# Patient Record
Sex: Male | Born: 1940 | Race: White | Hispanic: No | Marital: Married | Smoking: Former smoker
Health system: Southern US, Community
[De-identification: ages and names within clinical notes are randomized; demographics above are authoritative.]

## PROBLEM LIST (undated history)

## (undated) DIAGNOSIS — K219 Gastro-esophageal reflux disease without esophagitis: Secondary | ICD-10-CM

## (undated) DIAGNOSIS — N209 Urinary calculus, unspecified: Secondary | ICD-10-CM

## (undated) HISTORY — PX: EYE SURGERY: SHX253

---

## 2019-10-07 HISTORY — PX: CATARACT EXTRACTION: SUR2

## 2019-11-07 DIAGNOSIS — R17 Unspecified jaundice: Secondary | ICD-10-CM

## 2019-11-07 HISTORY — DX: Unspecified jaundice: R17

## 2019-11-29 ENCOUNTER — Inpatient Hospital Stay (HOSPITAL_COMMUNITY)
Admission: EM | Admit: 2019-11-29 | Discharge: 2019-12-03 | DRG: 435 | Disposition: A | Discharge status: Home / Self Care | Payer: Medicare Other | Source: Ambulatory Visit | Attending: Internal Medicine | Admitting: Internal Medicine

## 2019-11-29 ENCOUNTER — Ambulatory Visit: Payer: Medicare Other | Admitting: General Surgery

## 2019-11-29 ENCOUNTER — Encounter (HOSPITAL_COMMUNITY): Payer: Self-pay | Admitting: Emergency Medicine

## 2019-11-29 ENCOUNTER — Encounter (INDEPENDENT_AMBULATORY_CARE_PROVIDER_SITE_OTHER): Payer: Self-pay

## 2019-11-29 ENCOUNTER — Emergency Department (HOSPITAL_COMMUNITY): Payer: Medicare Other

## 2019-11-29 ENCOUNTER — Other Ambulatory Visit: Payer: Self-pay

## 2019-11-29 DIAGNOSIS — C801 Malignant (primary) neoplasm, unspecified: Secondary | ICD-10-CM | POA: Diagnosis not present

## 2019-11-29 DIAGNOSIS — N401 Enlarged prostate with lower urinary tract symptoms: Secondary | ICD-10-CM | POA: Diagnosis present

## 2019-11-29 DIAGNOSIS — R822 Biliuria: Secondary | ICD-10-CM | POA: Diagnosis present

## 2019-11-29 DIAGNOSIS — Z7982 Long term (current) use of aspirin: Secondary | ICD-10-CM | POA: Diagnosis not present

## 2019-11-29 DIAGNOSIS — Z20822 Contact with and (suspected) exposure to covid-19: Secondary | ICD-10-CM | POA: Diagnosis present

## 2019-11-29 DIAGNOSIS — Z87891 Personal history of nicotine dependence: Secondary | ICD-10-CM

## 2019-11-29 DIAGNOSIS — D689 Coagulation defect, unspecified: Secondary | ICD-10-CM | POA: Diagnosis present

## 2019-11-29 DIAGNOSIS — Z79899 Other long term (current) drug therapy: Secondary | ICD-10-CM

## 2019-11-29 DIAGNOSIS — E872 Acidosis: Secondary | ICD-10-CM | POA: Diagnosis not present

## 2019-11-29 DIAGNOSIS — K8689 Other specified diseases of pancreas: Secondary | ICD-10-CM | POA: Diagnosis not present

## 2019-11-29 DIAGNOSIS — D649 Anemia, unspecified: Secondary | ICD-10-CM | POA: Diagnosis present

## 2019-11-29 DIAGNOSIS — R1310 Dysphagia, unspecified: Secondary | ICD-10-CM | POA: Diagnosis present

## 2019-11-29 DIAGNOSIS — C250 Malignant neoplasm of head of pancreas: Secondary | ICD-10-CM | POA: Diagnosis present

## 2019-11-29 DIAGNOSIS — D630 Anemia in neoplastic disease: Secondary | ICD-10-CM | POA: Diagnosis present

## 2019-11-29 DIAGNOSIS — L299 Pruritus, unspecified: Secondary | ICD-10-CM | POA: Diagnosis not present

## 2019-11-29 DIAGNOSIS — K903 Pancreatic steatorrhea: Secondary | ICD-10-CM | POA: Diagnosis present

## 2019-11-29 DIAGNOSIS — K219 Gastro-esophageal reflux disease without esophagitis: Secondary | ICD-10-CM | POA: Diagnosis present

## 2019-11-29 DIAGNOSIS — M7989 Other specified soft tissue disorders: Secondary | ICD-10-CM | POA: Diagnosis present

## 2019-11-29 DIAGNOSIS — R634 Abnormal weight loss: Secondary | ICD-10-CM | POA: Diagnosis present

## 2019-11-29 DIAGNOSIS — R823 Hemoglobinuria: Secondary | ICD-10-CM | POA: Diagnosis present

## 2019-11-29 DIAGNOSIS — Z9889 Other specified postprocedural states: Secondary | ICD-10-CM | POA: Diagnosis not present

## 2019-11-29 DIAGNOSIS — R609 Edema, unspecified: Secondary | ICD-10-CM | POA: Diagnosis not present

## 2019-11-29 DIAGNOSIS — E785 Hyperlipidemia, unspecified: Secondary | ICD-10-CM | POA: Diagnosis present

## 2019-11-29 DIAGNOSIS — K831 Obstruction of bile duct: Secondary | ICD-10-CM | POA: Diagnosis present

## 2019-11-29 DIAGNOSIS — Z6825 Body mass index (BMI) 25.0-25.9, adult: Secondary | ICD-10-CM | POA: Diagnosis not present

## 2019-11-29 DIAGNOSIS — R17 Unspecified jaundice: Secondary | ICD-10-CM | POA: Diagnosis present

## 2019-11-29 HISTORY — DX: Gastro-esophageal reflux disease without esophagitis: K21.9

## 2019-11-29 HISTORY — DX: Urinary calculus, unspecified: N20.9

## 2019-11-29 LAB — CBC
HCT: 35 % — ABNORMAL LOW (ref 39.0–52.0)
Hemoglobin: 11.5 g/dL — ABNORMAL LOW (ref 13.0–17.0)
MCH: 30.3 pg (ref 26.0–34.0)
MCHC: 32.9 g/dL (ref 30.0–36.0)
MCV: 92.3 fL (ref 80.0–100.0)
Platelets: 320 10*3/uL (ref 150–400)
RBC: 3.79 MIL/uL — ABNORMAL LOW (ref 4.22–5.81)
RDW: 24 % — ABNORMAL HIGH (ref 11.5–15.5)
WBC: 8.5 10*3/uL (ref 4.0–10.5)
nRBC: 0 % (ref 0.0–0.2)

## 2019-11-29 LAB — URINALYSIS, ROUTINE W REFLEX MICROSCOPIC
Bacteria, UA: NONE SEEN
Glucose, UA: NEGATIVE mg/dL
Ketones, ur: NEGATIVE mg/dL
Leukocytes,Ua: NEGATIVE
Nitrite: NEGATIVE
Protein, ur: NEGATIVE mg/dL
Specific Gravity, Urine: >1.046 — ABNORMAL HIGH (ref 1.005–1.030)
pH: 5 (ref 5.0–8.0)

## 2019-11-29 LAB — COMPREHENSIVE METABOLIC PANEL
ALT: 425 U/L — ABNORMAL HIGH (ref 0–44)
AST: 758 U/L — ABNORMAL HIGH (ref 15–41)
Albumin: 2.5 g/dL — ABNORMAL LOW (ref 3.5–5.0)
Alkaline Phosphatase: 658 U/L — ABNORMAL HIGH (ref 38–126)
Anion gap: 10 (ref 5–15)
BUN: 28 mg/dL — ABNORMAL HIGH (ref 8–23)
CO2: 23 mmol/L (ref 22–32)
Calcium: 9.6 mg/dL (ref 8.9–10.3)
Chloride: 100 mmol/L (ref 98–111)
Creatinine, Ser: 0.86 mg/dL (ref 0.61–1.24)
GFR calc Af Amer: >60 mL/min (ref >60)
GFR calc non Af Amer: >60 mL/min (ref >60)
Glucose, Bld: 107 mg/dL — ABNORMAL HIGH (ref 70–99)
Potassium: 4.1 mmol/L (ref 3.5–5.1)
Sodium: 133 mmol/L — ABNORMAL LOW (ref 135–145)
Total Bilirubin: 19.7 mg/dL (ref 0.3–1.2)
Total Protein: 6.4 g/dL — ABNORMAL LOW (ref 6.5–8.1)

## 2019-11-29 LAB — PROTIME-INR
INR: 3.6 — ABNORMAL HIGH (ref 0.8–1.2)
Prothrombin Time: 36.2 seconds — ABNORMAL HIGH (ref 11.4–15.2)

## 2019-11-29 LAB — MAGNESIUM: Magnesium: 1.9 mg/dL (ref 1.7–2.4)

## 2019-11-29 LAB — PHOSPHORUS: Phosphorus: 3.1 mg/dL (ref 2.5–4.6)

## 2019-11-29 LAB — LIPASE, BLOOD: Lipase: 40 U/L (ref 11–51)

## 2019-11-29 MED ORDER — VITAMIN K1 10 MG/ML IJ SOLN
INTRAMUSCULAR | Status: AC
  Filled 2019-11-29: qty 1

## 2019-11-29 MED ORDER — SODIUM CHLORIDE 0.9 % IV SOLN: INTRAVENOUS | Status: DC

## 2019-11-29 MED ORDER — DIPHENHYDRAMINE HCL 25 MG PO CAPS
25.0000 mg | ORAL_CAPSULE | Freq: Four times a day (QID) | ORAL | Status: DC | PRN
  Administered 2019-11-29: 25 mg via ORAL
  Filled 2019-11-29: qty 1

## 2019-11-29 MED ORDER — VITAMIN K1 10 MG/ML IJ SOLN
5.0000 mg | Freq: Once | INTRAVENOUS | Status: AC
  Administered 2019-11-29: 20:00:00 5 mg via INTRAVENOUS
  Filled 2019-11-29: qty 0.5

## 2019-11-29 MED ORDER — IOHEXOL 300 MG/ML  SOLN
100.0000 mL | Freq: Once | INTRAMUSCULAR | Status: AC | PRN
  Administered 2019-11-29: 100 mL via INTRAVENOUS

## 2019-11-29 MED ORDER — SENNOSIDES-DOCUSATE SODIUM 8.6-50 MG PO TABS
1.0000 | ORAL_TABLET | Freq: Two times a day (BID) | ORAL | Status: DC
  Administered 2019-11-29 – 2019-12-03 (×5): 1 via ORAL
  Filled 2019-11-29 (×6): qty 1

## 2019-11-29 MED ORDER — OXYCODONE HCL 5 MG PO TABS
5.0000 mg | ORAL_TABLET | Freq: Four times a day (QID) | ORAL | Status: DC | PRN
  Administered 2019-12-01: 06:00:00 5 mg via ORAL
  Filled 2019-11-29 (×2): qty 1

## 2019-11-29 MED ORDER — SODIUM CHLORIDE 0.9% FLUSH
3.0000 mL | Freq: Once | INTRAVENOUS | Status: AC
  Administered 2019-11-29: 3 mL via INTRAVENOUS

## 2019-11-29 NOTE — H&P (Signed)
History and Physical    Jose Hale [id number] DOB: Oct 31, 1940 DOA: 11/29/2019  PCP: Quentin Cornwall, MD   Patient coming from: Home.  I have personally briefly reviewed patient's old medical records in [place]  Chief Complaint: Jaundice.  HPI: Jose Hale is a 79 y.o. male with medical history significant of GERD, urolithiasis who is coming to the emergency department referred by Dr. Arnoldo Morale after he presented there with 3-4 weeks of jaundice associated with abdominal distention, abdominal pain, light-colored stools, decreased appetite, nonintentional weight loss of about 10 pounds, fatigue,  pruritus and dark-colored urine.  He mentions that since mid January, after his cataract surgery, he felt like his digestion was not normal.  He denies diarrhea, but state he has felt constipated at times.  No melena or hematochezia.  He denies dysuria, frequency or hematuria.  About 2 weeks later, he was told that he looked jaundiced and he subsequently made an appointment with his doctor.  They had lab work, ultrasound all his RUQ done and told him that he needed to be seen by surgery and gastroenterology.  He went to see Dr. Arnoldo Morale today, who immediately referred him to the emergency department for further evaluation.  He denies fever, chills, sore throat, rhinorrhea, wheezing or hemoptysis.  No chest pain, dyspnea, palpitations, PND, orthopnea or lower extremity edema.  No polyuria, polydipsia, polyphagia or blurred vision.  ED Course: Initial vital signs temperature 98.6 F, pulse 70, respirations 16, blood pressure 131/83 mmHg and O2 sat 99% on room air.  Dr. Ashok Cordia discussed the case with gastroenterology, will evaluate the patient in the morning.  Urinalysis was amber in color, specific gravity was more than 1.046 with moderate hemoglobinuria and bilirubinuria.  White count was 8.5, hemoglobin 11.5 g/dL and platelets 320.  PT 36.2 and INR 3.6.  CMP shows sodium 133 mmol/L.  Calcium is  9.6, but corrected to albumin is 10.8 mg/dL.  All other electrolytes are within normal range glucose 107, BUN 28 and creatinine 0.86 mg/dL.  Total protein 6.4, albumin 2.5 g/dL.  AST 758, ALT 425 alkaline phosphatase 658 units/L.  Total bilirubin is 19.7 mg/dL.  Lipase was normal.  Review of Systems: As per HPI otherwise 10 point review of systems negative.   Past Medical History:  Diagnosis Date  . GERD (gastroesophageal reflux disease)   . Jaundice 11/2019  . Urolithiasis     Past Surgical History:  Procedure Laterality Date  . APPENDECTOMY  1981  . CATARACT EXTRACTION Left 10/2019     reports that he has quit smoking. He has never used smokeless tobacco. He reports current alcohol use of about 5.0 standard drinks of alcohol per week. He reports that he does not use drugs.  Not on File  Family History  Problem Relation Age of Onset  . Gallbladder disease Mother   . Heart disease Father   . Benign prostatic hyperplasia Father   . Gallbladder disease Sister   . GER disease Sister    Prior to Admission medications   Medication Sig Start Date End Date Taking? Authorizing Provider  aspirin 81 MG chewable tablet Chew by mouth.    [provider]  atorvastatin (LIPITOR) 40 MG tablet Take 40 mg by mouth daily. 10/31/19   [provider]  dutasteride (AVODART) 0.5 MG capsule Take 0.5 mg by mouth daily. 09/27/19   [provider]  LOTEMAX SM 0.38 % GEL  10/22/19   [provider]  moxifloxacin (VIGAMOX) 0.5 % ophthalmic solution  10/22/19   [provider]  MURO 128 2 % ophthalmic solution  10/13/19   [provider]  pantoprazole (PROTONIX) 40 MG tablet Take 40 mg by mouth daily. 07/20/19   [provider]  PROLENSA 0.07 % SOLN  08/26/19   [provider]  tamsulosin (FLOMAX) 0.4 MG CAPS capsule Take 0.4 mg by mouth daily. 08/17/19   [provider]  TRICOR 145 MG tablet Take 145 mg by mouth daily. 11/15/19    [provider]  ZYLET 0.5-0.3 % SUSP  09/14/19   [provider]    Physical Exam: Vitals:   11/29/19 2000 11/29/19 2030 11/29/19 2100 11/29/19 2137  BP: 106/70 118/64 119/68 (!) 145/72  Pulse: 70 64 69 80  Resp: 12 18 (!) 22   Temp:    98.4 F (36.9 C)  TempSrc:    Oral  SpO2: 100% 98% 98% 100%  Weight:    70.6 kg  Height:    5\' 6"  (1.676 m)    Constitutional: Looks chronically ill, but in NAD, calm, comfortable Eyes: PERRL, lids and conjunctivae normal.  Positive scleral icterus. ENMT: Mucous membranes are moist. Posterior pharynx clear of any exudate or lesions. Neck: normal, supple, no masses, no thyromegaly Respiratory: clear to auscultation bilaterally, no wheezing, no crackles. Normal respiratory effort. No accessory muscle use.  Cardiovascular: Regular rate and rhythm, no murmurs / rubs / gallops. No extremity edema. 2+ pedal pulses. No carotid bruits.  Abdomen: Distended.  BS positive.  Soft, positive epigastric tenderness, no guarding or rebound.  Positive hepatosplenomegaly. Musculoskeletal: no clubbing / cyanosis. Good ROM, no contractures. Normal muscle tone.  Skin: Jaundice with some erythematosus lesions on abdomen. Neurologic: CN 2-12 grossly intact. Sensation intact, DTR normal. Strength 5/5 in all 4.  Psychiatric: Normal judgment and insight. Alert and oriented x 3. Normal mood.   Labs on Admission: I have personally reviewed following labs and imaging studies  CBC: Recent Labs  Lab 11/29/19 1540  WBC 8.5  HGB 11.5*  HCT 35.0*  MCV 92.3  PLT 99991111   Basic Metabolic Panel: Recent Labs  Lab 11/29/19 1540  NA 133*  K 4.1  CL 100  CO2 23  GLUCOSE 107*  BUN 28*  CREATININE 0.86  CALCIUM 9.6  MG 1.9  PHOS 3.1   GFR: Estimated Creatinine Clearance: 64.9 mL/min (by C-G formula based on SCr of 0.86 mg/dL). Liver Function Tests: Recent Labs  Lab 11/29/19 1540  AST 758*  ALT 425*  ALKPHOS 658*  BILITOT 19.7*  PROT 6.4*    ALBUMIN 2.5*   Recent Labs  Lab 11/29/19 1540  LIPASE 40   No results for input(s): AMMONIA in the last 168 hours. Coagulation Profile: Recent Labs  Lab 11/29/19 1540  INR 3.6*   Cardiac Enzymes: No results for input(s): CKTOTAL, CKMB, CKMBINDEX, TROPONINI in the last 168 hours. BNP (last 3 results) No results for input(s): PROBNP in the last 8760 hours. HbA1C: No results for input(s): HGBA1C in the last 72 hours. CBG: No results for input(s): GLUCAP in the last 168 hours. Lipid Profile: No results for input(s): CHOL, HDL, LDLCALC, TRIG, CHOLHDL, LDLDIRECT in the last 72 hours. Thyroid Function Tests: No results for input(s): TSH, T4TOTAL, FREET4, T3FREE, THYROIDAB in the last 72 hours. Anemia Panel: No results for input(s): VITAMINB12, FOLATE, FERRITIN, TIBC, IRON, RETICCTPCT in the last 72 hours. Urine analysis:    Component Value Date/Time   COLORURINE AMBER (A) 11/29/2019 1828   APPEARANCEUR CLEAR 11/29/2019 1828  LABSPEC >1.046 (H) 11/29/2019 1828   PHURINE 5.0 11/29/2019 1828   GLUCOSEU NEGATIVE 11/29/2019 1828   HGBUR MODERATE (A) 11/29/2019 1828   BILIRUBINUR MODERATE (A) 11/29/2019 1828   KETONESUR NEGATIVE 11/29/2019 1828   PROTEINUR NEGATIVE 11/29/2019 1828   NITRITE NEGATIVE 11/29/2019 1828   LEUKOCYTESUR NEGATIVE 11/29/2019 1828    Radiological Exams on Admission: CT Abdomen Pelvis W Contrast  Result Date: 11/29/2019 CLINICAL DATA:  Painless jaundice. EXAM: CT ABDOMEN AND PELVIS WITH CONTRAST TECHNIQUE: Multidetector CT imaging of the abdomen and pelvis was performed using the standard protocol following bolus administration of intravenous contrast. CONTRAST:  115mL OMNIPAQUE IOHEXOL 300 MG/ML  SOLN COMPARISON:  None. FINDINGS: Lower chest: Bilateral pulmonary nodules. An irregular left lower lobe 1.9 cm nodule on 01/04. More posterior left lower lobe 1.6 cm nodule on 18/4. Normal heart size without pericardial or pleural effusion. Hepatobiliary: No  focal liver lesion. Gallbladder distension, without specific evidence of acute cholecystitis. Moderate intrahepatic biliary duct dilatation. The common duct measures 1.7 cm on coronal image 40. Followed to the level of the pancreatic head, where it undergoes an abrupt cutoff. Pancreas: Pancreatic atrophy and upstream duct dilatation. This continues to the level of the pancreatic head/neck junction, which soft tissue fullness measures on the order of 2.7 x 3.0 cm on 27/2. Subtle peripancreatic edema, for which superimposed pancreatitis cannot be excluded. Example thickening of the anterior pararenal fascia on 24/2. Spleen: Too small to characterize splenic lesions are of doubtful clinical significance. There is a hyperenhancing focus about the posterior spleen which may represent a hemangioma at 1.5 cm on 21/2. Adrenals/Urinary Tract: Normal adrenal glands. Punctate lower pole left renal collecting system calculi. Bilateral too small to characterize renal lesions. No hydronephrosis. Right bladder base 1.8 cm stone. Stomach/Bowel: Proximal gastric underdistention. Apparent gastric wall thickening, including on 18/2, is at least partially secondary. Scattered colonic diverticula.  Normal small bowel. Vascular/Lymphatic: Aortic atherosclerosis. Portal vein and splenoportal confluence involvement by tumor including on 28/2. No acute thrombus. 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 Preaortic node is not pathologic by size criteria at 8 mm on 37/2. No pelvic sidewall adenopathy. Reproductive: Normal prostate. Other: Small volume pelvic fluid. Multifocal omental thickening/nodularity, including at 1.9 cm on 43/2. Musculoskeletal: Bilateral hip degenerative changes. IMPRESSION: 1. 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 2. Moderate biliary and gallbladder distension/dilatation.  No specific evidence of acute cholecystitis. 3. Possible peripancreatic edema for which pancreatitis cannot be excluded. 4. Right bladder stone without hydronephrosis. 5. Left nephrolithiasis. 6. Small volume pelvic fluid. Electronically Signed   By: Abigail Miyamoto M.D.   On: 11/29/2019 18:39    EKG: Independently reviewed.  Assessment/Plan Principal Problem:   Hyperbilirubinemia Secondary to pancreatic mass. Admit to MedSurg/inpatient. Consult gastroenterology. Will likely need a CBD stent placement. N.p.o. after midnight. Benadryl 25 mg Q 6 hours as needed for pruritus Oxycodone 5 mg Q 6 hours as needed for pain.  Active Problems:   Pancreatic mass Check CA 19-9 marker. Check CEA. Staging imaging after 24+ hours from CT abdomen/pelvis.    Normocytic anemia Check anemia panel. Monitor H&H.    GERD (gastroesophageal reflux disease) Continue PPI.   DVT prophylaxis: SCDs. Code Status: Full code. Family Communication: Disposition Plan: Admit for GI evaluation, Probable ERCP and mass biopsy. Consults called: Routine gastroenterology consult. Admission status: Inpatient/MedSurg.   Reubin Milan MD [facility]  If 7PM-7AM, please contact night-coverage [email or web address]  11/29/2019, 11:53 PM   This document was prepared using Dragon voice recognition software and may contain some unintended transcription errors.

## 2019-11-29 NOTE — Progress Notes (Signed)
INR and PTT to be checked in am.

## 2019-11-29 NOTE — ED Provider Notes (Signed)
Patient sent by Dr. Arnoldo Morale with painless jaundice.  He apparently had a work-up in [place] and may have a common bile duct stone.  He likely needs admission and abdominal imaging as well as GI consultation.   Jose Essex, MD 11/29/19 [phone]

## 2019-11-29 NOTE — ED Provider Notes (Addendum)
[facility] Provider Note   CSN: [id number] Arrival date & time: 11/29/19  1409     History Chief Complaint  Patient presents with  . Abdominal Pain    Jose Hale is a 79 y.o. male.  He has a history of reflux.  He said he has been having on and off abdominal pain since January associate with sometimes some constipation sometimes diarrhea.  He has had fatigue.  Recently noticed he was turning yellow, dark urine, clay colored stools.  His primary care doctor said he was having liver problems and ordered an ultrasound that possibly showed some stones.  He saw Dr. Arnoldo Morale today general surgery referral who asked the patient come to the emergency department for admission.  No fevers or chills.  He says he drinks probably 3 times a week and no history of injection drug use.  He did have blood transfusions remotely.  Rates the pain is mild currently diffuse  The history is provided by the patient.  Abdominal Pain Pain location:  Generalized Pain quality: aching and bloating   Pain radiates to:  Does not radiate Pain severity:  Mild Onset quality:  Gradual Timing:  Intermittent Progression:  Worsening Chronicity:  New Context: alcohol use   Context: not trauma   Relieved by:  Nothing Worsened by:  Position changes and movement Ineffective treatments:  OTC medications Associated symptoms: constipation, diarrhea, fatigue and nausea   Associated symptoms: no chest pain, no cough, no dysuria, no fever, no hematemesis, no hematochezia, no hematuria, no melena, no shortness of breath, no sore throat and no vomiting        Past Medical History:  Diagnosis Date  . GERD (gastroesophageal reflux disease)   . Jaundice 11/2019    There are no problems to display for this patient.   Past Surgical History:  Procedure Laterality Date  . APPENDECTOMY  1981  . CATARACT EXTRACTION Left 10/2019       No family history on file.  Social History   Tobacco Use  .  Smoking status: Former Research scientist (life sciences)  . Smokeless tobacco: Never Used  Substance Use Topics  . Alcohol use: Yes    Alcohol/week: 5.0 standard drinks    Types: 1 Glasses of wine, 2 Cans of beer, 2 Shots of liquor per week    Comment: 2-4 times a week  . Drug use: Never    Home Medications Prior to Admission medications   Not on File    Allergies    Patient has no allergy information on record.  Review of Systems   Review of Systems  Constitutional: Positive for fatigue. Negative for fever.  HENT: Negative for sore throat.   Eyes: Negative for visual disturbance.  Respiratory: Negative for cough and shortness of breath.   Cardiovascular: Negative for chest pain.  Gastrointestinal: Positive for abdominal pain, constipation, diarrhea and nausea. Negative for hematemesis, hematochezia, melena and vomiting.  Genitourinary: Negative for dysuria and hematuria.  Musculoskeletal: Negative for neck pain.  Skin: Negative for rash.  Neurological: Negative for headaches.    Physical Exam Updated Vital Signs BP 131/83 (BP Location: Right Arm)   Pulse 70   Temp 98.6 F (37 C) (Oral)   Resp 16   Ht 5\' 6"  (1.676 m)   Wt 69.4 kg   SpO2 99%   BMI 24.69 kg/m   Physical Exam Vitals and nursing note reviewed.  Constitutional:      Appearance: He is well-developed.  HENT:     Head:  Normocephalic and atraumatic.  Eyes:     General: Scleral icterus present.     Conjunctiva/sclera: Conjunctivae normal.  Cardiovascular:     Rate and Rhythm: Normal rate and regular rhythm.     Heart sounds: No murmur.  Pulmonary:     Effort: Pulmonary effort is normal. No respiratory distress.     Breath sounds: Normal breath sounds.  Abdominal:     General: Abdomen is protuberant.     Tenderness: There is generalized abdominal tenderness.  Musculoskeletal:     Cervical back: Neck supple.  Skin:    General: Skin is warm and dry.     Capillary Refill: Capillary refill takes less than 2 seconds.      Coloration: Skin is jaundiced.  Neurological:     General: No focal deficit present.     Mental Status: He is alert.     ED Results / Procedures / Treatments   Labs (all labs ordered are listed, but only abnormal results are displayed) Labs Reviewed  COMPREHENSIVE METABOLIC PANEL - Abnormal; Notable for the following components:      Result Value   Sodium 133 (*)    Glucose, Bld 107 (*)    BUN 28 (*)    Total Protein 6.4 (*)    Albumin 2.5 (*)    AST 758 (*)    ALT 425 (*)    Alkaline Phosphatase 658 (*)    Total Bilirubin 19.7 (*)    All other components within normal limits  CBC - Abnormal; Notable for the following components:   RBC 3.79 (*)    Hemoglobin 11.5 (*)    HCT 35.0 (*)    RDW 24.0 (*)    All other components within normal limits  URINALYSIS, ROUTINE W REFLEX MICROSCOPIC - Abnormal; Notable for the following components:   Color, Urine AMBER (*)    Specific Gravity, Urine >1.046 (*)    Hgb urine dipstick MODERATE (*)    Bilirubin Urine MODERATE (*)    All other components within normal limits  PROTIME-INR - Abnormal; Notable for the following components:   Prothrombin Time 36.2 (*)    INR 3.6 (*)    All other components within normal limits  CBC WITH DIFFERENTIAL/PLATELET - Abnormal; Notable for the following components:   RBC 3.42 (*)    Hemoglobin 10.3 (*)    HCT 31.0 (*)    RDW 24.0 (*)    All other components within normal limits  HEPATIC FUNCTION PANEL - Abnormal; Notable for the following components:   Total Protein 5.6 (*)    Albumin 2.1 (*)    AST 627 (*)    ALT 343 (*)    Alkaline Phosphatase 596 (*)    Total Bilirubin 19.1 (*)    Bilirubin, Direct 14.0 (*)    Indirect Bilirubin 5.1 (*)    All other components within normal limits  BASIC METABOLIC PANEL - Abnormal; Notable for the following components:   BUN 24 (*)    All other components within normal limits  PROTIME-INR - Abnormal; Notable for the following components:   Prothrombin  Time 18.4 (*)    INR 1.5 (*)    All other components within normal limits  APTT - Abnormal; Notable for the following components:   aPTT 38 (*)    All other components within normal limits  SARS CORONAVIRUS 2 (TAT 6-24 HRS)  LIPASE, BLOOD  MAGNESIUM  PHOSPHORUS  HEPATITIS PANEL, ACUTE  CANCER ANTIGEN 19-9  CEA  EKG None  Radiology CT Abdomen Pelvis W Contrast  Result Date: 11/29/2019 CLINICAL DATA:  Painless jaundice. EXAM: CT ABDOMEN AND PELVIS WITH CONTRAST TECHNIQUE: Multidetector CT imaging of the abdomen and pelvis was performed using the standard protocol following bolus administration of intravenous contrast. CONTRAST:  154mL OMNIPAQUE IOHEXOL 300 MG/ML  SOLN COMPARISON:  None. FINDINGS: Lower chest: Bilateral pulmonary nodules. An irregular left lower lobe 1.9 cm nodule on 01/04. More posterior left lower lobe 1.6 cm nodule on 18/4. Normal heart size without pericardial or pleural effusion. Hepatobiliary: No focal liver lesion. Gallbladder distension, without specific evidence of acute cholecystitis. Moderate intrahepatic biliary duct dilatation. The common duct measures 1.7 cm on coronal image 40. Followed to the level of the pancreatic head, where it undergoes an abrupt cutoff. Pancreas: Pancreatic atrophy and upstream duct dilatation. This continues to the level of the pancreatic head/neck junction, which soft tissue fullness measures on the order of 2.7 x 3.0 cm on 27/2. Subtle peripancreatic edema, for which superimposed pancreatitis cannot be excluded. Example thickening of the anterior pararenal fascia on 24/2. Spleen: Too small to characterize splenic lesions are of doubtful clinical significance. There is a hyperenhancing focus about the posterior spleen which may represent a hemangioma at 1.5 cm on 21/2. Adrenals/Urinary Tract: Normal adrenal glands. Punctate lower pole left renal collecting system calculi. Bilateral too small to characterize renal lesions. No hydronephrosis.  Right bladder base 1.8 cm stone. Stomach/Bowel: Proximal gastric underdistention. Apparent gastric wall thickening, including on 18/2, is at least partially secondary. Scattered colonic diverticula.  Normal small bowel. Vascular/Lymphatic: Aortic atherosclerosis. Portal vein and splenoportal confluence involvement by tumor including on 28/2. No acute thrombus. 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 Preaortic node is not pathologic by size criteria at 8 mm on 37/2. No pelvic sidewall adenopathy. Reproductive: Normal prostate. Other: Small volume pelvic fluid. Multifocal omental thickening/nodularity, including at 1.9 cm on 43/2. Musculoskeletal: Bilateral hip degenerative changes. IMPRESSION: 1. 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 2. Moderate biliary and gallbladder distension/dilatation. No specific evidence of acute cholecystitis. 3. Possible peripancreatic edema for which pancreatitis cannot be excluded. 4. Right bladder stone without hydronephrosis. 5. Left nephrolithiasis. 6. Small volume pelvic fluid. Electronically Signed   By: Abigail Miyamoto M.D.   On: 11/29/2019 18:39    Procedures Procedures (including critical care time)  Medications Ordered in ED Medications  sodium chloride flush (NS) 0.9 % injection 3 mL (has no administration in time range)    ED Course  I have reviewed the triage vital signs and the nursing notes.  Pertinent labs & imaging results that were available during my care of the patient were reviewed by me and considered in my medical decision making (see chart for details).  Clinical Course as of Nov 29 1702  Tue Nov 29, 2019  1724 Patient here with jaundice painful.  Differential includes obstructive stone, pancreatic mass, liver lesions, metastatic disease.   [MB]  1909 Discussed with Dr. Laural Golden from GI.  He recommends  vitamin K and anticipation for ERCP in the a.m.  Hospitalist has been paged.   [MB]  1945 Discussed with Dr. Olevia Bowens [facility] who will evaluate the patient for admission.   [MB]  1949 Reviewed results with patient and need for admission for probable procedural stenting and working toward diagnosis. I did inform him that this could be due to a cancerous lesion.    [MB]  Clinical Course User Index [MB] Hayden Rasmussen, MD   MDM Rules/Calculators/A&P                       Final Clinical Impression(s) / ED Diagnoses Final diagnoses:  Hyperbilirubinemia  Pancreatic mass    Rx / DC Orders ED Discharge Orders    None       Hayden Rasmussen, MD 11/30/19 1013    Hayden Rasmussen, MD 11/30/19 [phone]

## 2019-11-29 NOTE — ED Notes (Signed)
Date and time results received: 02/23/211632 (use smartphrase ".now" to insert current time)  Test: total bilirubin  Critical Value: 19.7  Name of Provider Notified: butler,md  Orders Received? Or Actions Taken?:

## 2019-11-30 ENCOUNTER — Inpatient Hospital Stay (HOSPITAL_COMMUNITY): Payer: Medicare Other | Admitting: Anesthesiology

## 2019-11-30 ENCOUNTER — Inpatient Hospital Stay (HOSPITAL_COMMUNITY): Payer: Medicare Other

## 2019-11-30 ENCOUNTER — Encounter (HOSPITAL_COMMUNITY): Payer: Self-pay | Admitting: Internal Medicine

## 2019-11-30 ENCOUNTER — Encounter (HOSPITAL_COMMUNITY)
Admission: EM | Disposition: A | Discharge status: Home / Self Care | Payer: Self-pay | Source: Ambulatory Visit | Attending: Family Medicine

## 2019-11-30 DIAGNOSIS — K8689 Other specified diseases of pancreas: Secondary | ICD-10-CM

## 2019-11-30 DIAGNOSIS — K831 Obstruction of bile duct: Secondary | ICD-10-CM

## 2019-11-30 HISTORY — PX: ERCP: SHX5425

## 2019-11-30 LAB — CBC WITH DIFFERENTIAL/PLATELET
Abs Immature Granulocytes: 0.07 10*3/uL (ref 0.00–0.07)
Basophils Absolute: 0 10*3/uL (ref 0.0–0.1)
Basophils Relative: 1 %
Eosinophils Absolute: 0.2 10*3/uL (ref 0.0–0.5)
Eosinophils Relative: 2 %
HCT: 31 % — ABNORMAL LOW (ref 39.0–52.0)
Hemoglobin: 10.3 g/dL — ABNORMAL LOW (ref 13.0–17.0)
Immature Granulocytes: 1 %
Lymphocytes Relative: 13 %
Lymphs Abs: 0.9 10*3/uL (ref 0.7–4.0)
MCH: 30.1 pg (ref 26.0–34.0)
MCHC: 33.2 g/dL (ref 30.0–36.0)
MCV: 90.6 fL (ref 80.0–100.0)
Monocytes Absolute: 0.9 10*3/uL (ref 0.1–1.0)
Monocytes Relative: 12 %
Neutro Abs: 5.2 10*3/uL (ref 1.7–7.7)
Neutrophils Relative %: 71 %
Platelets: 272 10*3/uL (ref 150–400)
RBC: 3.42 MIL/uL — ABNORMAL LOW (ref 4.22–5.81)
RDW: 24 % — ABNORMAL HIGH (ref 11.5–15.5)
WBC: 7.3 10*3/uL (ref 4.0–10.5)
nRBC: 0 % (ref 0.0–0.2)

## 2019-11-30 LAB — HEPATIC FUNCTION PANEL
ALT: 343 U/L — ABNORMAL HIGH (ref 0–44)
AST: 627 U/L — ABNORMAL HIGH (ref 15–41)
Albumin: 2.1 g/dL — ABNORMAL LOW (ref 3.5–5.0)
Alkaline Phosphatase: 596 U/L — ABNORMAL HIGH (ref 38–126)
Bilirubin, Direct: 14 mg/dL — ABNORMAL HIGH (ref 0.0–0.2)
Indirect Bilirubin: 5.1 mg/dL — ABNORMAL HIGH (ref 0.3–0.9)
Total Bilirubin: 19.1 mg/dL (ref 0.3–1.2)
Total Protein: 5.6 g/dL — ABNORMAL LOW (ref 6.5–8.1)

## 2019-11-30 LAB — HEPATITIS PANEL, ACUTE
HCV Ab: NONREACTIVE
Hep A IgM: NONREACTIVE
Hep B C IgM: NONREACTIVE
Hepatitis B Surface Ag: NONREACTIVE

## 2019-11-30 LAB — BASIC METABOLIC PANEL
Anion gap: 9 (ref 5–15)
BUN: 24 mg/dL — ABNORMAL HIGH (ref 8–23)
CO2: 24 mmol/L (ref 22–32)
Calcium: 9.6 mg/dL (ref 8.9–10.3)
Chloride: 102 mmol/L (ref 98–111)
Creatinine, Ser: 0.67 mg/dL (ref 0.61–1.24)
GFR calc Af Amer: >60 mL/min (ref >60)
GFR calc non Af Amer: >60 mL/min (ref >60)
Glucose, Bld: 89 mg/dL (ref 70–99)
Potassium: 3.6 mmol/L (ref 3.5–5.1)
Sodium: 135 mmol/L (ref 135–145)

## 2019-11-30 LAB — APTT: aPTT: 38 seconds — ABNORMAL HIGH (ref 24–36)

## 2019-11-30 LAB — PROTIME-INR
INR: 1.5 — ABNORMAL HIGH (ref 0.8–1.2)
Prothrombin Time: 18.4 seconds — ABNORMAL HIGH (ref 11.4–15.2)

## 2019-11-30 LAB — SARS CORONAVIRUS 2 (TAT 6-24 HRS): SARS Coronavirus 2: NEGATIVE

## 2019-11-30 SURGERY — ERCP, WITH INTERVENTION IF INDICATED
Anesthesia: General

## 2019-11-30 MED ORDER — PROPOFOL 10 MG/ML IV BOLUS
INTRAVENOUS | Status: DC | PRN
  Administered 2019-11-30: 130 mg via INTRAVENOUS

## 2019-11-30 MED ORDER — ONDANSETRON HCL 4 MG/2ML IJ SOLN
INTRAMUSCULAR | Status: AC
  Filled 2019-11-30: qty 2

## 2019-11-30 MED ORDER — HYDROCODONE-ACETAMINOPHEN 7.5-325 MG PO TABS: 1.0000 | ORAL_TABLET | Freq: Once | ORAL | Status: DC | PRN

## 2019-11-30 MED ORDER — HYDROMORPHONE HCL 1 MG/ML IJ SOLN: 1.0000 mg | INTRAMUSCULAR | Status: DC | PRN

## 2019-11-30 MED ORDER — PHENOL 1.4 % MT LIQD
1.0000 | OROMUCOSAL | Status: DC | PRN
  Administered 2019-11-30: 1 via OROMUCOSAL
  Filled 2019-11-30: qty 177

## 2019-11-30 MED ORDER — SUCCINYLCHOLINE CHLORIDE 20 MG/ML IJ SOLN
INTRAMUSCULAR | Status: DC | PRN
  Administered 2019-11-30: 100 mg via INTRAVENOUS

## 2019-11-30 MED ORDER — MIDAZOLAM HCL 2 MG/2ML IJ SOLN: 0.5000 mg | Freq: Once | INTRAMUSCULAR | Status: DC | PRN

## 2019-11-30 MED ORDER — ONDANSETRON HCL 4 MG/2ML IJ SOLN
INTRAMUSCULAR | Status: DC | PRN
  Administered 2019-11-30: 4 mg via INTRAVENOUS

## 2019-11-30 MED ORDER — GLUCAGON HCL RDNA (DIAGNOSTIC) 1 MG IJ SOLR
INTRAMUSCULAR | Status: AC
  Filled 2019-11-30: qty 2

## 2019-11-30 MED ORDER — PANCRELIPASE (LIP-PROT-AMYL) 12000-38000 UNITS PO CPEP
36000.0000 [IU] | ORAL_CAPSULE | Freq: Three times a day (TID) | ORAL | Status: DC
  Administered 2019-11-30 – 2019-12-03 (×7): 36000 [IU] via ORAL
  Filled 2019-11-30 (×8): qty 3

## 2019-11-30 MED ORDER — FENTANYL CITRATE (PF) 100 MCG/2ML IJ SOLN
INTRAMUSCULAR | Status: DC | PRN
  Administered 2019-11-30: 25 ug via INTRAVENOUS

## 2019-11-30 MED ORDER — SODIUM CHLORIDE 0.9 % IV SOLN
INTRAVENOUS | Status: AC
  Filled 2019-11-30: qty 50

## 2019-11-30 MED ORDER — GLUCAGON HCL RDNA (DIAGNOSTIC) 1 MG IJ SOLR
INTRAMUSCULAR | Status: DC | PRN
  Administered 2019-11-30 (×5): .25 mg via INTRAVENOUS

## 2019-11-30 MED ORDER — LACTATED RINGERS IV SOLN: INTRAVENOUS | Status: DC

## 2019-11-30 MED ORDER — HYDROMORPHONE HCL 1 MG/ML IJ SOLN: 0.2500 mg | INTRAMUSCULAR | Status: DC | PRN

## 2019-11-30 MED ORDER — PROMETHAZINE HCL 25 MG/ML IJ SOLN: 6.2500 mg | INTRAMUSCULAR | Status: DC | PRN

## 2019-11-30 MED ORDER — SUCCINYLCHOLINE CHLORIDE 200 MG/10ML IV SOSY
PREFILLED_SYRINGE | INTRAVENOUS | Status: AC
  Filled 2019-11-30: qty 10

## 2019-11-30 MED ORDER — SODIUM CHLORIDE 0.9 % IV SOLN: INTRAVENOUS | Status: DC

## 2019-11-30 MED ORDER — WATER FOR IRRIGATION, STERILE IR SOLN
Status: DC | PRN
  Administered 2019-11-30: 1000 mL

## 2019-11-30 MED ORDER — TAMSULOSIN HCL 0.4 MG PO CAPS
0.4000 mg | ORAL_CAPSULE | Freq: Every day | ORAL | Status: DC
  Administered 2019-11-30 – 2019-12-02 (×3): 0.4 mg via ORAL
  Filled 2019-11-30 (×3): qty 1

## 2019-11-30 MED ORDER — CEFAZOLIN SODIUM-DEXTROSE 2-4 GM/100ML-% IV SOLN
INTRAVENOUS | Status: AC
  Filled 2019-11-30: qty 100

## 2019-11-30 MED ORDER — CEFAZOLIN SODIUM-DEXTROSE 2-3 GM-%(50ML) IV SOLR
INTRAVENOUS | Status: DC | PRN
  Administered 2019-11-30: 2 g via INTRAVENOUS

## 2019-11-30 MED ORDER — LACTATED RINGERS IV SOLN: INTRAVENOUS | Status: DC | PRN

## 2019-11-30 MED ORDER — FENTANYL CITRATE (PF) 100 MCG/2ML IJ SOLN
INTRAMUSCULAR | Status: AC
  Filled 2019-11-30: qty 2

## 2019-11-30 NOTE — Progress Notes (Signed)
Brief ERCP note.  Limited view of esophagus and stomach revealing no abnormality. Normal ampulla Vater with very small ampullary orifice. Excellent approach and axis but unable to advance guidewire into bile duct. Pancreatic duct was not cannulated or filled with contrast. No contrast was injected. Patient tolerated the procedure well.

## 2019-11-30 NOTE — Progress Notes (Signed)
Patient Demographics:    Jose Hale, is a 79 y.o. male, DOB - 01-14-1941, [id number]  Admit date - 11/29/2019   Admitting Physician Reubin Milan, MD  Outpatient Primary MD for the patient is Milam, Florene Route, MD  LOS - 1   Chief Complaint  Patient presents with  . Abdominal Pain        Subjective:    Jose Hale today has no fevers, no emesis,  No chest pain,  --Hungry, denies abdominal pain  Assessment  & Plan :    Principal Problem:   Hyperbilirubinemia Active Problems:   Pancreatic mass   Normocytic anemia   GERD (gastroesophageal reflux disease)  Brief Summary:- 79 y.o. male with medical history significant of GERD, urolithiasis  with concerns about 3-4 weeks of painless jaundice associated with abdominal distention, abdominal pain, light-colored stools, decreased appetite, nonintentional weight loss of about 10 pounds, fatigue,  pruritus and dark-colored urine--admitted on 11/29/2019 with abdominal imaging findings suggestive of metastatic pancreatic carcinoma  A/p 1)Painless jaundice/metastatic pancreatic cancer--- unsuccessful ERCP on 11/30/2019 -Plans for possible transfer to [facility] for GI/IR intervention, most likely will need a CBD stent  2) hyperbilirubinemia--- secondary to pancreatic mass as above #1  3)BPH with LUTs--- continue Flomax , hold Avodart  Disposition/Need for in-Hospital Stay- patient unable to be discharged at this time due to --- metastatic pancreatic cancer with painless jaundice----we need transfer to [facility] for GI/IR intervention and possible CBD stent placement  Code Status : full  Family Communication:   NA (patient is alert, awake and coherent)  Consults  :  Gi  DVT Prophylaxis  :   - SCDs /TEDs  Lab Results  Component Value Date   PLT 272 11/30/2019    Inpatient Medications  Scheduled Meds: . [START ON 12/01/2019]  lipase/protease/amylase  36,000 Units Oral TID WC  . senna-docusate  1 tablet Oral BID   Continuous Infusions: . sodium chloride 75 mL/hr at 11/30/19 1304  . sodium chloride     PRN Meds:.diphenhydrAMINE, oxyCODONE    Anti-infectives (From admission, onward)   None        Objective:   Vitals:   11/30/19 1422 11/30/19 1709 11/30/19 1715 11/30/19 1730  BP: 124/66 127/64 121/65 127/65  Pulse:  74 72 80  Resp:  19 14 14   Temp:  98.3 F (36.8 C)    TempSrc:      SpO2: 100% 100% 100% 99%  Weight:      Height:        Wt Readings from Last 3 Encounters:  11/29/19 70.6 kg     Intake/Output Summary (Last 24 hours) at 11/30/2019 1843 Last data filed at 11/30/2019 1736 Gross per 24 hour  Intake 1615 ml  Output 0 ml  Net 1615 ml     Physical Exam  Gen:- Awake Alert, jaundiced HEENT:- [place].AT, +ve sclera icterus Neck-Supple Neck,No JVD,.  Lungs-  CTAB , fair symmetrical air movement CV- S1, S2 normal, regular  Abd-  +ve B.Sounds, Abd Soft, No tenderness,    Extremity/Skin:- No  edema, pedal pulses present Psych-affect is appropriate, oriented x3 Neuro-no new focal deficits, no tremors   Data Review:   Micro Results Recent Results (from the past 240 hour(s))  SARS  CORONAVIRUS 2 (TAT 6-24 HRS) Nasopharyngeal Nasopharyngeal Swab     Status: None   Collection Time: 11/29/19  6:05 PM   Specimen: Nasopharyngeal Swab  Result Value Ref Range Status   SARS Coronavirus 2 NEGATIVE NEGATIVE Final    Comment: (NOTE) SARS-CoV-2 target nucleic acids are NOT DETECTED. The SARS-CoV-2 RNA is generally detectable in upper and lower respiratory specimens during the acute phase of infection. Negative results do not preclude SARS-CoV-2 infection, do not rule out co-infections with other pathogens, and should not be used as the sole basis for treatment or other patient management decisions. Negative results must be combined with clinical observations, patient history, and  epidemiological information. The expected result is Negative. Fact Sheet for Patients: SugarRoll.be Fact Sheet for Healthcare Providers: https://www.woods-mathews.com/ This test is not yet approved or cleared by the [place] FDA and  has been authorized for detection and/or diagnosis of SARS-CoV-2 by FDA under an Emergency Use Authorization (EUA). This EUA will remain  in effect (meaning this test can be used) for the duration of the COVID-19 declaration under Section 56 4(b)(1) of the Act, 21 U.S.C. section 360bbb-3(b)(1), unless the authorization is terminated or revoked sooner. Performed at [facility], [place] [address], [place], [place] [address]     Radiology Reports DG Abd 1 View  Result Date: 11/30/2019 CLINICAL DATA:  Attempted ERCP, jaundice, GERD EXAM: ABDOMEN - 1 VIEW COMPARISON:  None FLUOROSCOPY TIME:  0 minutes 26 seconds FINDINGS: CBD could not be cannulated. Visualized bowel gas in the RIGHT upper quadrant is unremarkable. No definite calcified gallstones are identified. IMPRESSION: No calcified gallstones are identified in the RIGHT upper quadrant. Electronically Signed   By: Lavonia Dana M.D.   On: 11/30/2019 17:45   CT Abdomen Pelvis W Contrast  Result Date: 11/29/2019 CLINICAL DATA:  Painless jaundice. EXAM: CT ABDOMEN AND PELVIS WITH CONTRAST TECHNIQUE: Multidetector CT imaging of the abdomen and pelvis was performed using the standard protocol following bolus administration of intravenous contrast. CONTRAST:  151mL OMNIPAQUE IOHEXOL 300 MG/ML  SOLN COMPARISON:  None. FINDINGS: Lower chest: Bilateral pulmonary nodules. An irregular left lower lobe 1.9 cm nodule on 01/04. More posterior left lower lobe 1.6 cm nodule on 18/4. Normal heart size without pericardial or pleural effusion. Hepatobiliary: No focal liver lesion. Gallbladder distension, without specific evidence of acute cholecystitis. Moderate intrahepatic biliary  duct dilatation. The common duct measures 1.7 cm on coronal image 40. Followed to the level of the pancreatic head, where it undergoes an abrupt cutoff. Pancreas: Pancreatic atrophy and upstream duct dilatation. This continues to the level of the pancreatic head/neck junction, which soft tissue fullness measures on the order of 2.7 x 3.0 cm on 27/2. Subtle peripancreatic edema, for which superimposed pancreatitis cannot be excluded. Example thickening of the anterior pararenal fascia on 24/2. Spleen: Too small to characterize splenic lesions are of doubtful clinical significance. There is a hyperenhancing focus about the posterior spleen which may represent a hemangioma at 1.5 cm on 21/2. Adrenals/Urinary Tract: Normal adrenal glands. Punctate lower pole left renal collecting system calculi. Bilateral too small to characterize renal lesions. No hydronephrosis. Right bladder base 1.8 cm stone. Stomach/Bowel: Proximal gastric underdistention. Apparent gastric wall thickening, including on 18/2, is at least partially secondary. Scattered colonic diverticula.  Normal small bowel. Vascular/Lymphatic: Aortic atherosclerosis. Portal vein and splenoportal confluence involvement by tumor including on 28/2. No acute thrombus. 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 Preaortic node is not pathologic by size criteria at 8 mm  on 37/2. No pelvic sidewall adenopathy. Reproductive: Normal prostate. Other: Small volume pelvic fluid. Multifocal omental thickening/nodularity, including at 1.9 cm on 43/2. Musculoskeletal: Bilateral hip degenerative changes. IMPRESSION: 1. 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 2. Moderate biliary and gallbladder distension/dilatation. No specific evidence of acute cholecystitis. 3. Possible peripancreatic edema for which pancreatitis cannot be excluded. 4.  Right bladder stone without hydronephrosis. 5. Left nephrolithiasis. 6. Small volume pelvic fluid. Electronically Signed   By: Abigail Miyamoto M.D.   On: 11/29/2019 18:39   DG C-Arm 1-60 Min-No Report  Result Date: 11/30/2019 Fluoroscopy was utilized by the requesting physician.  No radiographic interpretation.     CBC Recent Labs  Lab 11/29/19 1540 11/30/19 0442  WBC 8.5 7.3  HGB 11.5* 10.3*  HCT 35.0* 31.0*  PLT 320 272  MCV 92.3 90.6  MCH 30.3 30.1  MCHC 32.9 33.2  RDW 24.0* 24.0*  LYMPHSABS  --  0.9  MONOABS  --  0.9  EOSABS  --  0.2  BASOSABS  --  0.0    Chemistries  Recent Labs  Lab 11/29/19 1540 11/30/19 0442  NA 133* 135  K 4.1 3.6  CL 100 102  CO2 23 24  GLUCOSE 107* 89  BUN 28* 24*  CREATININE 0.86 0.67  CALCIUM 9.6 9.6  MG 1.9  --   AST 758* 627*  ALT 425* 343*  ALKPHOS 658* 596*  BILITOT 19.7* 19.1*   ------------------------------------------------------------------------------------------------------------------ No results for input(s): CHOL, HDL, LDLCALC, TRIG, CHOLHDL, LDLDIRECT in the last 72 hours.  No results found for: HGBA1C ------------------------------------------------------------------------------------------------------------------ No results for input(s): TSH, T4TOTAL, T3FREE, THYROIDAB in the last 72 hours.  Invalid input(s): FREET3 ------------------------------------------------------------------------------------------------------------------ No results for input(s): VITAMINB12, FOLATE, FERRITIN, TIBC, IRON, RETICCTPCT in the last 72 hours.  Coagulation profile Recent Labs  Lab 11/29/19 1540 11/30/19 0442  INR 3.6* 1.5*    No results for input(s): DDIMER in the last 72 hours.  Cardiac Enzymes No results for input(s): CKMB, TROPONINI, MYOGLOBIN in the last 168 hours.  Invalid input(s): CK ------------------------------------------------------------------------------------------------------------------ No results found  for: BNP   Roxan Hockey M.D on 11/30/2019 at 6:43 PM  Go to [email or web address] - for contact info  [facility] - Office  [phone]

## 2019-11-30 NOTE — Transfer of Care (Signed)
Immediate Anesthesia Transfer of Care Note  Patient: Jose Hale  Procedure(s) Performed: UNSUCCESSFUL ENDOSCOPIC RETROGRADE CHOLANGIOPANCREATOGRAPHY (ERCP) (N/A )  Patient Location: PACU  Anesthesia Type:General  Level of Consciousness: awake and patient cooperative  Airway & Oxygen Therapy: Patient Spontanous Breathing and non-rebreather face mask  Post-op Assessment: Report given to RN and Post -op Vital signs reviewed and stable  Post vital signs: Reviewed and stable  Last Vitals:  Vitals Value Taken Time  BP    Temp 98.3   Pulse    Resp 10 11/30/19 1711  SpO2 100 % 11/30/19 1711  Vitals shown include unvalidated device data.  Last Pain:  Vitals:   11/30/19 1421  TempSrc: Oral  PainSc:       Patients Stated Pain Goal: 5 ([id number])  Complications: No apparent anesthesia complications

## 2019-11-30 NOTE — Op Note (Signed)
[facility] Patient Name: Jose Hale Procedure Date: 11/30/2019 2:13 PM MRN: [id number] Date of Birth: [birth year] Attending MD: Hildred Laser , MD CSN: [id number] Age: 79 Admit Type: Inpatient Procedure:                ERCP Indications:              Malignant tumor of the head of pancreas.                            Obstructive Jaundice. Providers:                Hildred Laser, MD, Gwynneth Albright RN, RN,                            Raphael Gibney, Technician Referring MD:             Roxan Hockey, MD Medicines:                General Anesthesia Complications:            No immediate complications. Estimated Blood Loss:     Estimated blood loss: none. Procedure:                Pre-Anesthesia Assessment:                           - Prior to the procedure, a History and Physical                            was performed, and patient medications and                            allergies were reviewed. The patient's tolerance of                            previous anesthesia was also reviewed. The risks                            and benefits of the procedure and the sedation                            options and risks were discussed with the patient.                            All questions were answered, and informed consent                            was obtained. Prior Anticoagulants: The patient has                            taken no previous anticoagulant or antiplatelet                            agents. ASA Grade Assessment: III - A patient with  severe systemic disease. After reviewing the risks                            and benefits, the patient was deemed in                            satisfactory condition to undergo the procedure.                           After obtaining informed consent, the scope was                            passed under direct vision. Throughout the                            procedure, the patient's blood  pressure, pulse, and                            oxygen saturations were monitored continuously. The                            [id number]) scope was introduced through the                            mouth, and used to inject contrast into and used to                            inject contrast into the bile duct. The ERCP was                            technically difficult and complex due to CBD could                            not be cannulated. The patient tolerated the                            procedure well. Scope In: 3:46:37 PM Scope Out: 4:54:51 PM Total Procedure Duration: 1 hour 8 minutes 14 seconds  Findings:      The scout film was normal. The esophagus was successfully intubated       under direct vision. The scope was advanced to a normal major papilla in       the descending duodenum without detailed examination of the pharynx,       larynx and associated structures, and upper GI tract. The upper GI tract       was grossly normal. The bile duct could not be cannulated with the       short-nosed traction sphincterotome. Elected not to do precut.      Scope examined after removal and tip was in appropriate position. Impression:               Unsuccesful ERCP. Unable to cannulate CBD with                            guidewire.  Normal ampulla of Vater.                           No contrast injected during the procedure. Moderate Sedation:      Per Anesthesia Care Recommendation:           - Return patient to hospital ward for ongoing care.                           - Clear liquid diet today.                           - Continue present medications.                           Referral to [facility] for repeat ERCP. Procedure Code(s):        --- Professional ---                           [id number], Endoscopic retrograde                            cholangiopancreatography (ERCP); diagnostic,                            including collection of specimen(s) by  brushing or                            washing, when performed (separate procedure) Diagnosis Code(s):        --- Professional ---                           C25.0, Malignant neoplasm of head of pancreas CPT copyright 2019 American Medical Association. All rights reserved. The codes documented in this report are preliminary and upon coder review may  be revised to meet current compliance requirements. Hildred Laser, MD Hildred Laser, MD 11/30/2019 5:33:40 PM This report has been signed electronically. Number of Addenda: 0

## 2019-11-30 NOTE — Anesthesia Preprocedure Evaluation (Signed)
Anesthesia Evaluation  Patient identified by MRN, date of birth, ID band Patient awake    Reviewed: Allergy & Precautions, NPO status , Patient's Chart, lab work & pertinent test results  Airway Mallampati: II  TM Distance: >3 FB Neck ROM: Full    Dental no notable dental hx. 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, community  Denies previous limitations    Neuro/Psych negative neurological ROS  negative psych ROS   GI/Hepatic Neg liver ROS, GERD  Medicated and Controlled,  Endo/Other  negative endocrine ROS  Renal/GU negative Renal ROS  negative genitourinary   Musculoskeletal negative musculoskeletal ROS (+)   Abdominal   Peds negative pediatric ROS (+)  Hematology negative hematology ROS (+) anemia , INR 1.5 PTT38 Albumin low 2.1   Anesthesia Other Findings   Reproductive/Obstetrics negative OB ROS                             Anesthesia Physical Anesthesia Plan  ASA: IV  Anesthesia Plan: General   Post-op Pain Management:    Induction: Intravenous  PONV Risk Score and Plan: 2 and Ondansetron, Treatment may vary due to age or medical condition and Dexamethasone  Airway Management Planned: Oral ETT  Additional Equipment:   Intra-op Plan:   Post-operative Plan: Extubation in OR  Informed Consent: I have reviewed the patients History and Physical, chart, labs and discussed the procedure including the risks, benefits and alternatives for the proposed anesthesia with the patient or authorized representative who has indicated his/her understanding and acceptance.     Dental advisory  given  Plan Discussed with: CRNA  Anesthesia Plan Comments: (Plan Full PPE use Plan GETA D/W PT -WTP with same after Q&A  D/w pt possibility of postprocedural ventilation -voiced understanding WTP)        Anesthesia Quick Evaluation

## 2019-11-30 NOTE — Anesthesia Procedure Notes (Signed)
Procedure Name: Intubation Date/Time: 11/30/2019 3:34 PM Performed by: Vista Deck, CRNA Pre-anesthesia Checklist: Patient identified, Patient being monitored, Timeout performed, Emergency Drugs available and Suction available Patient Re-evaluated:Patient Re-evaluated prior to induction Oxygen Delivery Method: Circle System Utilized Preoxygenation: Pre-oxygenation with 100% oxygen Induction Type: IV induction Laryngoscope Size: Mac and 3 Grade View: Grade II Tube type: Oral Tube size: 7.0 mm Number of attempts: 1 Airway Equipment and Method: stylet Placement Confirmation: ETT inserted through vocal cords under direct vision,  positive ETCO2 and breath sounds checked- equal and bilateral Secured at: 22 cm Tube secured with: Tape Dental Injury: Teeth and Oropharynx as per pre-operative assessment

## 2019-11-30 NOTE — Consult Note (Signed)
[facility] Consultation Oncology  Name: Jose Hale      MRN: [id number]    Location: A309/A309-01  Date: 11/30/2019 Time:7:39 PM   REFERRING PHYSICIAN: Dr. 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  He presented with jaundice for the last 3 weeks.  He was seen by Jose Hale and was referred to the ER.  A CT scan of the abdomen and pelvis with contrast on 11/29/2019 showed moderate intrahepatic biliary ductal dilatation.  CBD measures 1.7 cm.  Pancreatic atrophy and upstream duct dilatation.  This continues to the level of the pancreatic head/neck junction with soft tissue fullness measuring 2.7 x 3.0 cm.  Subtle peripancreatic edema.  Portal vein and splenoportal confluence involvement by tumor with no acute thrombus.  Preaortic node is not pathologic in size.  Multifocal omental thickening/nodularity including 1.9 cm.  Patient underwent ERCP by Jose Hale today.  Limited view of esophagus and stomach revealing no abnormality.  Normal ampulla Vater with very small ampullary orifice.  Unable to advance guidewire into the bile duct.  Pancreatic duct was not cannulated or filled with contrast.  No contrast was injected.  He reported 11 pound weight loss in the last 3 weeks.  Has vague abdominal pain associated with some constipation.  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  Family history significant for mother with breast cancer.  PAST MEDICAL HISTORY:   Past Medical History:  Diagnosis Date  . GERD (gastroesophageal reflux disease)   . Jaundice 11/2019  . Urolithiasis     ALLERGIES: Not on File    MEDICATIONS: I have reviewed the patient's current medications.     PAST SURGICAL  HISTORY Past Surgical History:  Procedure Laterality Date  . CATARACT EXTRACTION Left 10/2019  . EYE SURGERY     jan 2006    FAMILY HISTORY: Family History  Problem Relation Age of Onset  . Gallbladder disease Mother   . Heart disease Father   . Benign prostatic hyperplasia Father   . Gallbladder disease Sister   . GER disease Sister     SOCIAL HISTORY:  reports that he has quit smoking. He has never used smokeless tobacco. He reports current alcohol use of about 5.0 standard drinks of alcohol per week. He reports that he does not use drugs.  PERFORMANCE STATUS: The patient's performance status is 1 - Symptomatic but completely ambulatory  PHYSICAL EXAM: Most Recent Vital Signs: Blood pressure 121/64, pulse 81, temperature 98 F (36.7 C), temperature source Oral, resp. rate 15, height 5\' 6"  (1.676 m), weight 155 lb 9.6 oz (70.6 kg), SpO2 99 %. BP 121/64 (BP Location: Left Arm)   Pulse 81   Temp 98 F (36.7 C) (Oral)   Resp 15   Ht 5\' 6"  (1.676 m)   Wt 155 lb 9.6 oz (70.6 kg)   SpO2 99%   BMI 25.11 kg/m  General appearance: alert, cooperative and appears stated age Head: Normocephalic, without obvious abnormality, atraumatic Neck: no adenopathy and supple, symmetrical, trachea midline Lungs: clear to auscultation bilaterally Abdomen: Soft, mild distention with vague tenderness.  No masses palpable. Extremities: extremities normal, atraumatic, no cyanosis or edema Skin: Jaundice positive. Lymph nodes: Cervical, supraclavicular, and axillary nodes normal. Neurologic: Grossly normal  LABORATORY DATA:  Results for orders placed or performed during the hospital encounter of 11/29/19 (from the past 48 hour(s))  Lipase, blood     Status: None   Collection Time: 11/29/19  3:40 PM  Result Value Ref Range   Lipase 40 11 - 51 U/L    Comment: Performed at [facility] [address], [place], [place] [address]  Comprehensive metabolic panel     Status: Abnormal   Collection  Time: 11/29/19  3:40 PM  Result Value Ref Range   Sodium 133 (L) 135 - 145 mmol/L   Potassium 4.1 3.5 - 5.1 mmol/L   Chloride 100 98 - 111 mmol/L   CO2 23 22 - 32 mmol/L   Glucose, Bld 107 (H) 70 - 99 mg/dL    Comment: Glucose reference range applies only to samples taken after fasting for at least 8 hours.   BUN 28 (H) 8 - 23 mg/dL   Creatinine, Ser 0.86 0.61 - 1.24 mg/dL   Calcium 9.6 8.9 - 10.3 mg/dL   Total Protein 6.4 (L) 6.5 - 8.1 g/dL   Albumin 2.5 (L) 3.5 - 5.0 g/dL   AST 758 (H) 15 - 41 U/L   ALT 425 (H) 0 - 44 U/L   Alkaline Phosphatase 658 (H) 38 - 126 U/L   Total Bilirubin 19.7 (HH) 0.3 - 1.2 mg/dL    Comment: CRITICAL RESULT CALLED TO, READ BACK BY AND VERIFIED WITH: WESTON,L AT 1632 ON 2.23.21 BY ISLEY,B    GFR calc non Af Amer >60 >60 mL/min   GFR calc Af Amer >60 >60 mL/min   Anion gap 10 5 - 15    Comment: Performed at [facility], [address], [place], [place] [address]  CBC     Status: Abnormal   Collection Time: 11/29/19  3:40 PM  Result Value Ref Range   WBC 8.5 4.0 - 10.5 K/uL   RBC 3.79 (L) 4.22 - 5.81 MIL/uL   Hemoglobin 11.5 (L) 13.0 - 17.0 g/dL   HCT 35.0 (L) 39.0 - 52.0 %   MCV 92.3 80.0 - 100.0 fL   MCH 30.3 26.0 - 34.0 pg   MCHC 32.9 30.0 - 36.0 g/dL   RDW 24.0 (H) 11.5 - 15.5 %   Platelets 320 150 - 400 K/uL   nRBC 0.0 0.0 - 0.2 %    Comment: Performed at [facility], [address] [place], [place] [address]  Protime-INR     Status: Abnormal   Collection Time: 11/29/19  3:40 PM  Result Value Ref Range   Prothrombin Time 36.2 (H) 11.4 - 15.2 seconds   INR 3.6 (H) 0.8 - 1.2    Comment: (NOTE) INR goal varies based on device and disease states. Performed at [facility], [address] [place], [place] [address]   Hepatitis panel, acute     Status: None   Collection Time: 11/29/19  3:40 PM  Result Value Ref Range   Hepatitis B Surface Ag NON REACTIVE NON REACTIVE   HCV Ab NON REACTIVE NON REACTIVE    Comment: (NOTE) Nonreactive HCV  antibody screen is consistent with no HCV infections,  unless recent infection is suspected or other evidence exists to indicate HCV infection.    Hep A IgM NON REACTIVE NON REACTIVE   Hep B C IgM NON REACTIVE NON REACTIVE    Comment: Performed at [facility], [place] [address], [place], [place] [address]  Magnesium     Status: None  Collection Time: 11/29/19  3:40 PM  Result Value Ref Range   Magnesium 1.9 1.7 - 2.4 mg/dL    Comment: Performed at [facility], [address], [place], [place] [address]  Phosphorus     Status: None   Collection Time: 11/29/19  3:40 PM  Result Value Ref Range   Phosphorus 3.1 2.5 - 4.6 mg/dL    Comment: Performed at [facility], [address] [place], [place] [address]  SARS CORONAVIRUS 2 (TAT 6-24 HRS) Nasopharyngeal Nasopharyngeal Swab     Status: None   Collection Time: 11/29/19  6:05 PM   Specimen: Nasopharyngeal Swab  Result Value Ref Range   SARS Coronavirus 2 NEGATIVE NEGATIVE    Comment: (NOTE) SARS-CoV-2 target nucleic acids are NOT DETECTED. The SARS-CoV-2 RNA is generally detectable in upper and lower respiratory specimens during the acute phase of infection. Negative results do not preclude SARS-CoV-2 infection, do not rule out co-infections with other pathogens, and should not be used as the sole basis for treatment or other patient management decisions. Negative results must be combined with clinical observations, patient history, and epidemiological information. The expected result is Negative. Fact Sheet for Patients: SugarRoll.be Fact Sheet for Healthcare Providers: https://www.woods-mathews.com/ This test is not yet approved or cleared by the [place] FDA and  has been authorized for detection and/or diagnosis of SARS-CoV-2 by FDA under an Emergency Use Authorization (EUA). This EUA will remain  in effect (meaning this test can be used) for the duration of the COVID-19  declaration under Section 56 4(b)(1) of the Act, 21 U.S.C. section 360bbb-3(b)(1), unless the authorization is terminated or revoked sooner. Performed at [facility], [place] [address] [place], [place] [address]   Urinalysis, Routine w reflex microscopic     Status: Abnormal   Collection Time: 11/29/19  6:28 PM  Result Value Ref Range   Color, Urine AMBER (A) YELLOW    Comment: BIOCHEMICALS MAY BE AFFECTED BY COLOR   APPearance CLEAR CLEAR   Specific Gravity, Urine >1.046 (H) 1.005 - 1.030   pH 5.0 5.0 - 8.0   Glucose, UA NEGATIVE NEGATIVE mg/dL   Hgb urine dipstick MODERATE (A) NEGATIVE   Bilirubin Urine MODERATE (A) NEGATIVE   Ketones, ur NEGATIVE NEGATIVE mg/dL   Protein, ur NEGATIVE NEGATIVE mg/dL   Nitrite NEGATIVE NEGATIVE   Leukocytes,Ua NEGATIVE NEGATIVE   RBC / HPF 11-20 0 - 5 RBC/hpf   WBC, UA 0-5 0 - 5 WBC/hpf   Bacteria, UA NONE SEEN NONE SEEN   Squamous Epithelial / LPF 0-5 0 - 5   Mucus PRESENT    Hyaline Casts, UA PRESENT     Comment: Performed at [facility], [address], [place], [place] [address]  CBC WITH DIFFERENTIAL     Status: Abnormal   Collection Time: 11/30/19  4:42 AM  Result Value Ref Range   WBC 7.3 4.0 - 10.5 K/uL   RBC 3.42 (L) 4.22 - 5.81 MIL/uL   Hemoglobin 10.3 (L) 13.0 - 17.0 g/dL   HCT 31.0 (L) 39.0 - 52.0 %   MCV 90.6 80.0 - 100.0 fL   MCH 30.1 26.0 - 34.0 pg   MCHC 33.2 30.0 - 36.0 g/dL   RDW 24.0 (H) 11.5 - 15.5 %   Platelets 272 150 - 400 K/uL   nRBC 0.0 0.0 - 0.2 %   Neutrophils Relative % 71 %   Neutro Abs 5.2 1.7 - 7.7 K/uL   Lymphocytes Relative 13 %  Lymphs Abs 0.9 0.7 - 4.0 K/uL   Monocytes Relative 12 %   Monocytes Absolute 0.9 0.1 - 1.0 K/uL   Eosinophils Relative 2 %   Eosinophils Absolute 0.2 0.0 - 0.5 K/uL   Basophils Relative 1 %   Basophils Absolute 0.0 0.0 - 0.1 K/uL   Immature Granulocytes 1 %   Abs Immature Granulocytes 0.07 0.00 - 0.07 K/uL    Comment: Performed at [facility], [address],  [place], [place] [address]  Hepatic function panel     Status: Abnormal   Collection Time: 11/30/19  4:42 AM  Result Value Ref Range   Total Protein 5.6 (L) 6.5 - 8.1 g/dL   Albumin 2.1 (L) 3.5 - 5.0 g/dL   AST 627 (H) 15 - 41 U/L   ALT 343 (H) 0 - 44 U/L   Alkaline Phosphatase 596 (H) 38 - 126 U/L   Total Bilirubin 19.1 (HH) 0.3 - 1.2 mg/dL    Comment: CRITICAL RESULT CALLED TO, READ BACK BY AND VERIFIED WITH: GRAVES,K AT 6:20AM ON 11/30/19 BY FESTERMAN,C    Bilirubin, Direct 14.0 (H) 0.0 - 0.2 mg/dL    Comment: RESULTS CONFIRMED BY MANUAL DILUTION   Indirect Bilirubin 5.1 (H) 0.3 - 0.9 mg/dL    Comment: Performed at [facility], [address] [place], [place] [id number]  Basic metabolic panel     Status: Abnormal   Collection Time: 11/30/19  4:42 AM  Result Value Ref Range   Sodium 135 135 - 145 mmol/L   Potassium 3.6 3.5 - 5.1 mmol/L   Chloride 102 98 - 111 mmol/L   CO2 24 22 - 32 mmol/L   Glucose, Bld 89 70 - 99 mg/dL    Comment: Glucose reference range applies only to samples taken after fasting for at least 8 hours.   BUN 24 (H) 8 - 23 mg/dL   Creatinine, Ser 0.67 0.61 - 1.24 mg/dL   Calcium 9.6 8.9 - 10.3 mg/dL   GFR calc non Af Amer >60 >60 mL/min   GFR calc Af Amer >60 >60 mL/min   Anion gap 9 5 - 15    Comment: Performed at [facility], [address], [place], [place] [address]  Protime-INR     Status: Abnormal   Collection Time: 11/30/19  4:42 AM  Result Value Ref Range   Prothrombin Time 18.4 (H) 11.4 - 15.2 seconds   INR 1.5 (H) 0.8 - 1.2    Comment: (NOTE) INR goal varies based on device and disease states. Performed at [facility], [address], [place], [place] [address]   APTT     Status: Abnormal   Collection Time: 11/30/19  4:42 AM  Result Value Ref Range   aPTT 38 (H) 24 - 36 seconds    Comment:        IF BASELINE aPTT IS ELEVATED, SUGGEST PATIENT RISK ASSESSMENT BE USED TO DETERMINE APPROPRIATE ANTICOAGULANT THERAPY. Performed at [facility], [address], [place], [place] [address]       RADIOGRAPHY: DG Abd 1 View  Result Date: 11/30/2019 CLINICAL DATA:  Attempted ERCP, jaundice, GERD EXAM: ABDOMEN - 1 VIEW COMPARISON:  None FLUOROSCOPY TIME:  0 minutes 26 seconds FINDINGS: CBD could not be cannulated. Visualized bowel gas in the RIGHT upper quadrant is unremarkable. No definite calcified gallstones are identified. IMPRESSION: No calcified gallstones are identified in the RIGHT upper quadrant. Electronically Signed   By: Lavonia Dana M.D.   On: 11/30/2019 17:45  CT Abdomen Pelvis W Contrast  Result Date: 11/29/2019 CLINICAL DATA:  Painless jaundice. EXAM: CT ABDOMEN AND PELVIS WITH CONTRAST TECHNIQUE: Multidetector CT imaging of the abdomen and pelvis was performed using the standard protocol following bolus administration of intravenous contrast. CONTRAST:  116mL OMNIPAQUE IOHEXOL 300 MG/ML  SOLN COMPARISON:  None. FINDINGS: Lower chest: Bilateral pulmonary nodules. An irregular left lower lobe 1.9 cm nodule on 01/04. More posterior left lower lobe 1.6 cm nodule on 18/4. Normal heart size without pericardial or pleural effusion. Hepatobiliary: No focal liver lesion. Gallbladder distension, without specific evidence of acute cholecystitis. Moderate intrahepatic biliary duct dilatation. The common duct measures 1.7 cm on coronal image 40. Followed to the level of the pancreatic head, where it undergoes an abrupt cutoff. Pancreas: Pancreatic atrophy and upstream duct dilatation. This continues to the level of the pancreatic head/neck junction, which soft tissue fullness measures on the order of 2.7 x 3.0 cm on 27/2. Subtle peripancreatic edema, for which superimposed pancreatitis cannot be excluded. Example thickening of the anterior pararenal fascia on 24/2. Spleen: Too small to characterize splenic lesions are of doubtful clinical significance. There is a hyperenhancing focus about the posterior spleen which may represent a hemangioma at  1.5 cm on 21/2. Adrenals/Urinary Tract: Normal adrenal glands. Punctate lower pole left renal collecting system calculi. Bilateral too small to characterize renal lesions. No hydronephrosis. Right bladder base 1.8 cm stone. Stomach/Bowel: Proximal gastric underdistention. Apparent gastric wall thickening, including on 18/2, is at least partially secondary. Scattered colonic diverticula.  Normal small bowel. Vascular/Lymphatic: Aortic atherosclerosis. Portal vein and splenoportal confluence involvement by tumor including on 28/2. No acute thrombus. 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 Preaortic node is not pathologic by size criteria at 8 mm on 37/2. No pelvic sidewall adenopathy. Reproductive: Normal prostate. Other: Small volume pelvic fluid. Multifocal omental thickening/nodularity, including at 1.9 cm on 43/2. Musculoskeletal: Bilateral hip degenerative changes. IMPRESSION: 1. 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 2. Moderate biliary and gallbladder distension/dilatation. No specific evidence of acute cholecystitis. 3. Possible peripancreatic edema for which pancreatitis cannot be excluded. 4. Right bladder stone without hydronephrosis. 5. Left nephrolithiasis. 6. Small volume pelvic fluid. Electronically Signed   By: Abigail Miyamoto M.D.   On: 11/29/2019 18:39   DG C-Arm 1-60 Min-No Report  Result Date: 11/30/2019 Fluoroscopy was utilized by the requesting physician.  No radiographic interpretation.         ASSESSMENT and PLAN:  1.  Pancreatic cancer: -Presentation with jaundice and weight loss of 10 pounds over the last 3 weeks. -CT of the abdomen and pelvis with contrast on 11/29/2019 shows moderate intrahepatic biliary ductal dilatation.  Pancreatic atrophy with upstream duct dilatation.  2.7 x 3.0 cm subtle peripancreatic edema.  Omental nodularity  measuring 1.9 cm. -There are at least 2 lung nodules in the left lower lobe. -Underwent ERCP by JoseRehman.  Unable to advance guidewire into the bile duct.  Pancreatic duct was not cannulated or filled with contrast. -He will need biliary decompression and biopsy.  I had prolonged discussion about various possibilities and treatment plan. -We will check CA 19-9 level.  Would also recommend a PET scan as outpatient or a CT scan of the chest. -Germline mutation testing will be recommended as outpatient.   All questions were answered. The patient knows to call the clinic with any problems, questions or concerns. We can certainly see the patient much sooner  if necessary.   Derek Jack

## 2019-11-30 NOTE — Consult Note (Signed)
GI Inpatient Consult Note  Reason for Consult: jaundice    Attending Requesting Consult: Dr. Olevia Bowens   History of Present Illness: Jose Hale is a 79 y.o. male seen for evaluation of jaundice. PMHX of hyperlipidemia and GERD.  He was seen by surgery yesterday and sent to the emergency room by Dr. Arnoldo Morale for painless jaundice.  Overall reports he was feeling in his normal state of health and still working full-time until the end of December 2020.  He had cataract surgery in January 2021 which is around the time his GI symptoms seem to start and was initially felt related to surgery.  Developed issues with both abdominal pain with constipation.  Was unable to see his PCP in person given concern cough was related to Covid but recommended to do a bowel prep.  This ultimately led to loose stools which have been persistent over the past 6 to 8 weeks as well as developed consistently clay colored consistency 3-4 times a day.  He is also having some pain after eating as well as upper abdominal pain worse when he bends over.  Overall his appetite has been fairly poor, he has mainly done the brat diet and is eating soft bland foods such as chicken and rice most days.  He has had some food aversions as well.  Reports a 10 pound weight loss over the past 1 to 2 months unintentionally.  Very occasional dysphagia.  Chronic GERD well-controlled on Nexium 40 mg with occasional OTC Pepcid.  He denies any NSAID use.  He does drink alcohol 2-4 times a week with 1-3 drinks per sitting.  No chronic health concerns.   Last Colonoscopy: about 5 years ago - Dr Britta Mccreedy - normal Last Endoscopy: per patient about 8 years ago for GERD, normal    Past Medical History:  Past Medical History:  Diagnosis Date  . GERD (gastroesophageal reflux disease)   . Jaundice 11/2019  . Urolithiasis     Problem List: Patient Active Problem List   Diagnosis Date Noted  . Hyperbilirubinemia 11/29/2019  . Pancreatic mass  11/29/2019  . Normocytic anemia 11/29/2019  . GERD (gastroesophageal reflux disease)     Past Surgical History: Past Surgical History:  Procedure Laterality Date  . APPENDECTOMY  1981  . CATARACT EXTRACTION Left 10/2019    Allergies: Not on File  Home Medications: Medications Prior to Admission  Medication Sig Dispense Refill Last Dose  . aspirin 81 MG chewable tablet Chew by mouth.     Marland Kitchen atorvastatin (LIPITOR) 40 MG tablet Take 40 mg by mouth daily.     Marland Kitchen dutasteride (AVODART) 0.5 MG capsule Take 0.5 mg by mouth daily.     Marland Kitchen LOTEMAX SM 0.38 % GEL      . moxifloxacin (VIGAMOX) 0.5 % ophthalmic solution      . MURO 128 2 % ophthalmic solution      . pantoprazole (PROTONIX) 40 MG tablet Take 40 mg by mouth daily.     Marland Kitchen PROLENSA 0.07 % SOLN      . tamsulosin (FLOMAX) 0.4 MG CAPS capsule Take 0.4 mg by mouth daily.     . TRICOR 145 MG tablet Take 145 mg by mouth daily.     Marland Kitchen ZYLET 0.5-0.3 % SUSP       Home medication reconciliation was completed with the patient.   Scheduled Inpatient Medications:   . senna-docusate  1 tablet Oral BID    Continuous Inpatient Infusions:   . sodium chloride 75  mL/hr at 11/29/19 2000    PRN Inpatient Medications:  diphenhydrAMINE, oxyCODONE  Family History: family history includes Benign prostatic hyperplasia in his father; GER disease in his sister; Gallbladder disease in his mother and sister; Heart disease in his father.   Reports mother had a history of breast cancer-denies any other family cancer history.  Social History:   reports that he has quit smoking. He has never used smokeless tobacco. He reports current alcohol use of about 5.0 standard drinks of alcohol per week. He reports that he does not use drugs.   Review of Systems: Constitutional: Weight is stable.  Eyes: No changes in vision. ENT: No oral lesions, sore throat.  GI: see HPI.  Heme/Lymph: No easy bruising.  CV: No chest pain.  GU: No hematuria.  Integumentary: No  rashes.  Neuro: No headaches.  Psych: No depression/anxiety.  Endocrine: No heat/cold intolerance.  Allergic/Immunologic: No urticaria.  Resp: No cough, SOB.  Musculoskeletal: No joint swelling.    Physical Examination: BP 128/71 (BP Location: Right Arm)   Pulse 88   Temp 98.1 F (36.7 C) (Oral)   Resp 16   Ht 5\' 6"  (1.676 m)   Wt 70.6 kg   SpO2 97%   BMI 25.11 kg/m  Gen: NAD, alert and oriented x 4, jaundice HEENT: PEERLA, EOMI, Neck: supple, no JVD or thyromegaly Chest: CTA bilaterally, no wheezes, crackles, or other adventitious sounds CV: RRR, no m/g/c/r Abd: soft, mild tenderness to palpation upper abdomen, ND, +BS in all four quadrants; no HSM, guarding, ridigity, or rebound tenderness Ext: no edema, well perfused with 2+ pulses, Skin: no rash or lesions noted Lymph: no LAD  Data: Lab Results  Component Value Date   WBC 7.3 11/30/2019   HGB 10.3 (L) 11/30/2019   HCT 31.0 (L) 11/30/2019   MCV 90.6 11/30/2019   PLT 272 11/30/2019   Recent Labs  Lab 11/29/19 1540 11/30/19 0442  HGB 11.5* 10.3*   Lab Results  Component Value Date   NA 135 11/30/2019   K 3.6 11/30/2019   CL 102 11/30/2019   CO2 24 11/30/2019   BUN 24 (H) 11/30/2019   CREATININE 0.67 11/30/2019   CEA and CA19-9 pending Albumin 2.5   Lab Results  Component Value Date   ALT 343 (H) 11/30/2019   AST 627 (H) 11/30/2019   ALKPHOS 596 (H) 11/30/2019   BILITOT 19.1 (HH) 11/30/2019   Recent Labs  Lab 11/30/19 0442  APTT 38*  INR 1.5*  INR yesterday was 3.6     CT a/p 11/29/19-IMPRESSION: 1. 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 2. Moderate biliary and gallbladder distension/dilatation. No specific evidence of acute cholecystitis. 3. Possible peripancreatic edema for which pancreatitis cannot be excluded. 4. Right bladder stone without hydronephrosis. 5. Left  nephrolithiasis. 6. Small volume pelvic fluid.     Assessment/Plan: Jose Hale is a 79 y.o. male admitted for jaundice with 10 pound unintentional weight loss and notes 6-week history of upper abdominal pain. CT as above. We will plan for ERCP to get tissue diagnosis and hopefully place a stent for symptomatic improvement today.  Long-term consider MRCP versus EUS with biopsy for further evaluation  INR elevated yesterday but has improved after vitamin K to 1.5.  He denies prior issues with sedation.  Risk benefits and alternatives of ERCP reviewed   Patient denies CP, SOB, and use of blood thinners. I discussed the  risks and benefits of procedure including bleeding, perforation, infection, missed lesions, medication reactions and possible hospitalization or surgery if complications. All questions answered.    Case was discussed with Dr. Laural Golden. Thank you for the consult. Please call with questions or concerns.  Laurine Blazer, PA-C [facility]

## 2019-11-30 NOTE — Anesthesia Postprocedure Evaluation (Signed)
Anesthesia Post Note  Patient: Jose Hale  Procedure(s) Performed: UNSUCCESSFUL ENDOSCOPIC RETROGRADE CHOLANGIOPANCREATOGRAPHY (ERCP) (N/A )  Patient location during evaluation: PACU Anesthesia Type: General Level of consciousness: awake and alert and patient cooperative Pain management: satisfactory to patient Vital Signs Assessment: post-procedure vital signs reviewed and stable Respiratory status: spontaneous breathing Cardiovascular status: stable Postop Assessment: no apparent nausea or vomiting Anesthetic complications: no     Last Vitals:  Vitals:   11/30/19 1715 11/30/19 1730  BP: 121/65 127/65  Pulse: 72 80  Resp: 14 14  Temp:    SpO2: 100% 99%    Last Pain:  Vitals:   11/30/19 1730  TempSrc:   PainSc: 0-No pain                 Velisa Regnier

## 2019-12-01 LAB — COMPREHENSIVE METABOLIC PANEL
ALT: 268 U/L — ABNORMAL HIGH (ref 0–44)
AST: 500 U/L — ABNORMAL HIGH (ref 15–41)
Albumin: 1.9 g/dL — ABNORMAL LOW (ref 3.5–5.0)
Alkaline Phosphatase: 535 U/L — ABNORMAL HIGH (ref 38–126)
Anion gap: 8 (ref 5–15)
BUN: 25 mg/dL — ABNORMAL HIGH (ref 8–23)
CO2: 21 mmol/L — ABNORMAL LOW (ref 22–32)
Calcium: 9 mg/dL (ref 8.9–10.3)
Chloride: 107 mmol/L (ref 98–111)
Creatinine, Ser: 0.62 mg/dL (ref 0.61–1.24)
GFR calc Af Amer: >60 mL/min (ref >60)
GFR calc non Af Amer: >60 mL/min (ref >60)
Glucose, Bld: 99 mg/dL (ref 70–99)
Potassium: 3.6 mmol/L (ref 3.5–5.1)
Sodium: 136 mmol/L (ref 135–145)
Total Bilirubin: 20.4 mg/dL (ref 0.3–1.2)
Total Protein: 5.1 g/dL — ABNORMAL LOW (ref 6.5–8.1)

## 2019-12-01 LAB — CEA: CEA: 25.5 ng/mL — ABNORMAL HIGH (ref 0.0–4.7)

## 2019-12-01 LAB — CANCER ANTIGEN 19-9: CA 19-9: 14036 U/mL — ABNORMAL HIGH (ref 0–35)

## 2019-12-01 MED ORDER — CHOLESTYRAMINE 4 G PO PACK
4.0000 g | PACK | Freq: Three times a day (TID) | ORAL | Status: DC
  Administered 2019-12-01 – 2019-12-03 (×2): 4 g via ORAL
  Filled 2019-12-01 (×9): qty 1

## 2019-12-01 NOTE — H&P (View-Only) (Signed)
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  The patient started to feel poorly in the middle of January.  His symptoms consisted of fatigue, anorexia, and a 10 lbs weight loss.  He did not notice jaundice, but staff noticed jaundice where he was working part-time.  The patient sought an evaluation from his PCP and an abdominal ultrasound was performed.  Per his report, he had issues with gallstones and he was referred to Dr. Arnoldo Morale.  This past Tuesday he was evaluated by Dr. Arnoldo Morale for a potential cholecystectomy, but he was promptly admitted to [facility].  Further work up revealed a pancreatic mass in the head/neck region measuring 2.7 x 3.0 cm.  The CBD was dilated up to 1.7 cm.  Dr. Laural Golden attempted an ERCP yesterday, but he was unable to cannulate.  As a result he was sent down to [facility] for a repeat attempt at ERCP.  Past Medical History:  Diagnosis Date  . GERD (gastroesophageal reflux disease)   . Jaundice 11/2019  . Urolithiasis     Past Surgical History:  Procedure Laterality Date  . CATARACT EXTRACTION Left 10/2019  . EYE SURGERY     jan 2006    Family History  Problem Relation Age of Onset  . Gallbladder disease Mother   . Heart disease Father   . Benign prostatic hyperplasia Father   . Gallbladder disease Sister   . GER disease Sister     Social History:  reports that he has quit smoking. He has never used smokeless tobacco. He reports current alcohol use of about 5.0 standard drinks of alcohol per week. He reports that he does not use drugs.  Allergies: Not on File  Medications:  Scheduled: . cholestyramine  4 g Oral TID  . lipase/protease/amylase  36,000 Units Oral TID WC  . senna-docusate  1 tablet Oral BID  . tamsulosin  0.4 mg Oral QPC supper   Continuous: . sodium chloride  75 mL/hr at 12/01/19 1738    Results for orders placed or performed during the hospital encounter of 11/29/19 (from the past 24 hour(s))  Comprehensive metabolic panel     Status: Abnormal   Collection Time: 12/01/19  5:29 AM  Result Value Ref Range   Sodium 136 135 - 145 mmol/L   Potassium 3.6 3.5 - 5.1 mmol/L   Chloride 107 98 - 111 mmol/L   CO2 21 (L) 22 - 32 mmol/L   Glucose, Bld 99 70 - 99 mg/dL   BUN 25 (H) 8 - 23 mg/dL   Creatinine, Ser 0.62 0.61 - 1.24 mg/dL   Calcium 9.0 8.9 - 10.3 mg/dL   Total Protein 5.1 (L) 6.5 - 8.1 g/dL   Albumin 1.9 (L) 3.5 - 5.0 g/dL   AST 500 (H) 15 - 41 U/L   ALT 268 (H) 0 - 44 U/L   Alkaline Phosphatase 535 (H) 38 - 126 U/L   Total Bilirubin 20.4 (HH) 0.3 - 1.2 mg/dL   GFR calc non Af Amer >60 >60 mL/min   GFR calc Af Amer >60 >60 mL/min   Anion gap 8 5 - 15     DG Abd 1 View  Result Date: 11/30/2019 CLINICAL DATA:  Attempted ERCP, jaundice, GERD EXAM: ABDOMEN - 1 VIEW COMPARISON:  None FLUOROSCOPY TIME:  0 minutes 26 seconds FINDINGS: CBD could not be  cannulated. Visualized bowel gas in the RIGHT upper quadrant is unremarkable. No definite calcified gallstones are identified. IMPRESSION: No calcified gallstones are identified in the RIGHT upper quadrant. Electronically Signed   By: Lavonia Dana M.D.   On: 11/30/2019 17:45   DG C-Arm 1-60 Min-No Report  Result Date: 11/30/2019 Fluoroscopy was utilized by the requesting physician.  No radiographic interpretation.    ROS:  As stated above in the HPI otherwise negative.  Blood pressure 118/60, pulse (!) 58, temperature 98.2 F (36.8 C), temperature source Oral, resp. rate 17, height 5\' 6"  (1.676 m), weight 70.6 kg, SpO2 98 %.    PE: Gen: NAD, Alert and Oriented, jaundiced HEENT:  [place]/AT, EOMI, icteric Neck: Supple, no LAD Lungs: CTA Bilaterally CV: RRR without M/G/R ABM: Soft, mid abdominal discomfort, +BS Ext: No C/C/E  Assessment/Plan: 1) Pancreatic head/neck mass. 2) Biliary  obstruction. 3) Weight loss. 4) Jaundice.   The patient's Ca19-9 is elevated at 14,036.   The current findings are suggestive of a pancreatic cancer.  Further treatment will be performed with an repeat ERCP.  The repeat ERCP will not be easy as malignant cases are the most difficult.  If the ERCP is unsuccessful, IR will be consulted to perform a percutaneous drain with eventual internalization of a stent.  An EUS may be performed tomorrow to obtain tissue.  Skyann Ganim D 12/01/2019, 6:31 PM

## 2019-12-01 NOTE — Progress Notes (Signed)
CRITICAL VALUE ALERT  Critical Value:  Total Bilirubin 20.4  Date & Time Notied:  [id number] 12/01/19  Provider Notified: Scherrie November  Orders Received/Actions taken:

## 2019-12-01 NOTE — Progress Notes (Signed)
Social visit.  Answered any questions patient had.  Appreciate input from GI and Oncology.  Call me if I can assist.

## 2019-12-01 NOTE — H&P (View-Only) (Signed)
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  The patient started to feel poorly in the middle of January.  His symptoms consisted of fatigue, anorexia, and a 10 lbs weight loss.  He did not notice jaundice, but staff noticed jaundice where he was working part-time.  The patient sought an evaluation from his PCP and an abdominal ultrasound was performed.  Per his report, he had issues with gallstones and he was referred to Dr. Arnoldo Morale.  This past Tuesday he was evaluated by Dr. Arnoldo Morale for a potential cholecystectomy, but he was promptly admitted to [facility].  Further work up revealed a pancreatic mass in the head/neck region measuring 2.7 x 3.0 cm.  The CBD was dilated up to 1.7 cm.  Dr. Laural Golden attempted an ERCP yesterday, but he was unable to cannulate.  As a result he was sent down to [facility] for a repeat attempt at ERCP.  Past Medical History:  Diagnosis Date  . GERD (gastroesophageal reflux disease)   . Jaundice 11/2019  . Urolithiasis     Past Surgical History:  Procedure Laterality Date  . CATARACT EXTRACTION Left 10/2019  . EYE SURGERY     jan 2006    Family History  Problem Relation Age of Onset  . Gallbladder disease Mother   . Heart disease Father   . Benign prostatic hyperplasia Father   . Gallbladder disease Sister   . GER disease Sister     Social History:  reports that he has quit smoking. He has never used smokeless tobacco. He reports current alcohol use of about 5.0 standard drinks of alcohol per week. He reports that he does not use drugs.  Allergies: Not on File  Medications:  Scheduled: . cholestyramine  4 g Oral TID  . lipase/protease/amylase  36,000 Units Oral TID WC  . senna-docusate  1 tablet Oral BID  . tamsulosin  0.4 mg Oral QPC supper   Continuous: . sodium chloride  75 mL/hr at 12/01/19 1738    Results for orders placed or performed during the hospital encounter of 11/29/19 (from the past 24 hour(s))  Comprehensive metabolic panel     Status: Abnormal   Collection Time: 12/01/19  5:29 AM  Result Value Ref Range   Sodium 136 135 - 145 mmol/L   Potassium 3.6 3.5 - 5.1 mmol/L   Chloride 107 98 - 111 mmol/L   CO2 21 (L) 22 - 32 mmol/L   Glucose, Bld 99 70 - 99 mg/dL   BUN 25 (H) 8 - 23 mg/dL   Creatinine, Ser 0.62 0.61 - 1.24 mg/dL   Calcium 9.0 8.9 - 10.3 mg/dL   Total Protein 5.1 (L) 6.5 - 8.1 g/dL   Albumin 1.9 (L) 3.5 - 5.0 g/dL   AST 500 (H) 15 - 41 U/L   ALT 268 (H) 0 - 44 U/L   Alkaline Phosphatase 535 (H) 38 - 126 U/L   Total Bilirubin 20.4 (HH) 0.3 - 1.2 mg/dL   GFR calc non Af Amer >60 >60 mL/min   GFR calc Af Amer >60 >60 mL/min   Anion gap 8 5 - 15     DG Abd 1 View  Result Date: 11/30/2019 CLINICAL DATA:  Attempted ERCP, jaundice, GERD EXAM: ABDOMEN - 1 VIEW COMPARISON:  None FLUOROSCOPY TIME:  0 minutes 26 seconds FINDINGS: CBD could not be  cannulated. Visualized bowel gas in the RIGHT upper quadrant is unremarkable. No definite calcified gallstones are identified. IMPRESSION: No calcified gallstones are identified in the RIGHT upper quadrant. Electronically Signed   By: Lavonia Dana M.D.   On: 11/30/2019 17:45   DG C-Arm 1-60 Min-No Report  Result Date: 11/30/2019 Fluoroscopy was utilized by the requesting physician.  No radiographic interpretation.    ROS:  As stated above in the HPI otherwise negative.  Blood pressure 118/60, pulse (!) 58, temperature 98.2 F (36.8 C), temperature source Oral, resp. rate 17, height 5\' 6"  (1.676 m), weight 70.6 kg, SpO2 98 %.    PE: Gen: NAD, Alert and Oriented, jaundiced HEENT:  [place]/AT, EOMI, icteric Neck: Supple, no LAD Lungs: CTA Bilaterally CV: RRR without M/G/R ABM: Soft, mid abdominal discomfort, +BS Ext: No C/C/E  Assessment/Plan: 1) Pancreatic head/neck mass. 2) Biliary  obstruction. 3) Weight loss. 4) Jaundice.   The patient's Ca19-9 is elevated at 14,036.   The current findings are suggestive of a pancreatic cancer.  Further treatment will be performed with an repeat ERCP.  The repeat ERCP will not be easy as malignant cases are the most difficult.  If the ERCP is unsuccessful, IR will be consulted to perform a percutaneous drain with eventual internalization of a stent.  An EUS may be performed tomorrow to obtain tissue.  Ryenn Howeth D 12/01/2019, 6:31 PM

## 2019-12-01 NOTE — Consult Note (Signed)
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  The patient started to feel poorly in the middle of January.  His symptoms consisted of fatigue, anorexia, and a 10 lbs weight loss.  He did not notice jaundice, but staff noticed jaundice where he was working part-time.  The patient sought an evaluation from his PCP and an abdominal ultrasound was performed.  Per his report, he had issues with gallstones and he was referred to Dr. Arnoldo Morale.  This past Tuesday he was evaluated by Dr. Arnoldo Morale for a potential cholecystectomy, but he was promptly admitted to [facility].  Further work up revealed a pancreatic mass in the head/neck region measuring 2.7 x 3.0 cm.  The CBD was dilated up to 1.7 cm.  Dr. Laural Golden attempted an ERCP yesterday, but he was unable to cannulate.  As a result he was sent down to [facility] for a repeat attempt at ERCP.  Past Medical History:  Diagnosis Date  . GERD (gastroesophageal reflux disease)   . Jaundice 11/2019  . Urolithiasis     Past Surgical History:  Procedure Laterality Date  . CATARACT EXTRACTION Left 10/2019  . EYE SURGERY     jan 2006    Family History  Problem Relation Age of Onset  . Gallbladder disease Mother   . Heart disease Father   . Benign prostatic hyperplasia Father   . Gallbladder disease Sister   . GER disease Sister     Social History:  reports that he has quit smoking. He has never used smokeless tobacco. He reports current alcohol use of about 5.0 standard drinks of alcohol per week. He reports that he does not use drugs.  Allergies: Not on File  Medications:  Scheduled: . cholestyramine  4 g Oral TID  . lipase/protease/amylase  36,000 Units Oral TID WC  . senna-docusate  1 tablet Oral BID  . tamsulosin  0.4 mg Oral QPC supper   Continuous: . sodium chloride  75 mL/hr at 12/01/19 1738    Results for orders placed or performed during the hospital encounter of 11/29/19 (from the past 24 hour(s))  Comprehensive metabolic panel     Status: Abnormal   Collection Time: 12/01/19  5:29 AM  Result Value Ref Range   Sodium 136 135 - 145 mmol/L   Potassium 3.6 3.5 - 5.1 mmol/L   Chloride 107 98 - 111 mmol/L   CO2 21 (L) 22 - 32 mmol/L   Glucose, Bld 99 70 - 99 mg/dL   BUN 25 (H) 8 - 23 mg/dL   Creatinine, Ser 0.62 0.61 - 1.24 mg/dL   Calcium 9.0 8.9 - 10.3 mg/dL   Total Protein 5.1 (L) 6.5 - 8.1 g/dL   Albumin 1.9 (L) 3.5 - 5.0 g/dL   AST 500 (H) 15 - 41 U/L   ALT 268 (H) 0 - 44 U/L   Alkaline Phosphatase 535 (H) 38 - 126 U/L   Total Bilirubin 20.4 (HH) 0.3 - 1.2 mg/dL   GFR calc non Af Amer >60 >60 mL/min   GFR calc Af Amer >60 >60 mL/min   Anion gap 8 5 - 15     DG Abd 1 View  Result Date: 11/30/2019 CLINICAL DATA:  Attempted ERCP, jaundice, GERD EXAM: ABDOMEN - 1 VIEW COMPARISON:  None FLUOROSCOPY TIME:  0 minutes 26 seconds FINDINGS: CBD could not be  cannulated. Visualized bowel gas in the RIGHT upper quadrant is unremarkable. No definite calcified gallstones are identified. IMPRESSION: No calcified gallstones are identified in the RIGHT upper quadrant. Electronically Signed   By: Lavonia Dana M.D.   On: 11/30/2019 17:45   DG C-Arm 1-60 Min-No Report  Result Date: 11/30/2019 Fluoroscopy was utilized by the requesting physician.  No radiographic interpretation.    ROS:  As stated above in the HPI otherwise negative.  Blood pressure 118/60, pulse (!) 58, temperature 98.2 F (36.8 C), temperature source Oral, resp. rate 17, height 5\' 6"  (1.676 m), weight 70.6 kg, SpO2 98 %.    PE: Gen: NAD, Alert and Oriented, jaundiced HEENT:  [place]/AT, EOMI, icteric Neck: Supple, no LAD Lungs: CTA Bilaterally CV: RRR without M/G/R ABM: Soft, mid abdominal discomfort, +BS Ext: No C/C/E  Assessment/Plan: 1) Pancreatic head/neck mass. 2) Biliary  obstruction. 3) Weight loss. 4) Jaundice.   The patient's Ca19-9 is elevated at 14,036.   The current findings are suggestive of a pancreatic cancer.  Further treatment will be performed with an repeat ERCP.  The repeat ERCP will not be easy as malignant cases are the most difficult.  If the ERCP is unsuccessful, IR will be consulted to perform a percutaneous drain with eventual internalization of a stent.  An EUS may be performed tomorrow to obtain tissue.  Dennice Tindol D 12/01/2019, 6:31 PM

## 2019-12-01 NOTE — Progress Notes (Signed)
Report given to [facility] as well as Percell Locus, Therapist, sports at [employer].

## 2019-12-01 NOTE — Progress Notes (Signed)
Patient Demographics:    Jose Hale, is a 79 y.o. male, DOB - 04-Feb-1941, [id number]  Admit date - 11/29/2019   Admitting Physician Reubin Milan, MD  Outpatient Primary MD for the patient is Quentin Cornwall, MD  LOS - 2  Chief Complaint  Patient presents with   Abdominal Pain        Subjective:    Jose Hale today has no fevers, no emesis,  No chest pain,  -Clay colored Stools persist -Denies abdominal pain -Pruritus persist  Assessment  & Plan :    Principal Problem:   Hyperbilirubinemia Active Problems:   Pancreatic mass   Normocytic anemia   GERD (gastroesophageal reflux disease)  Brief Summary:- 79 y.o. male with medical history significant of GERD, urolithiasis  with concerns about 3-4 weeks of painless jaundice associated with abdominal distention,  light-colored stools, decreased appetite, nonintentional weight loss of about 10 pounds, fatigue,  pruritus and dark-colored urine--admitted on 11/29/2019 with painless jaundice and abdominal imaging findings suggestive of metastatic pancreatic carcinoma  -Transfer to [facility] on 12/01/2019 for ERCP and further GI/IR evaluation  A/p 1)Painless jaundice/Metastatic Pancreatic Cancer--- unsuccessful ERCP on 11/30/2019 -  transfer to [facility] for ERCP and GI/IR intervention, most likely will need a CBD stent -CA 19-9 is 14,036 -CEA is 25.5 -INR is 1.5, albumin is 1.9 Total bilirubin is up to 20.4 - AST >> 758>>627>>500 ALT >>425>>343>>268 Alk Phos 658>>596>>535  2)Hyperbilirubinemia/Persistent Pruritus --- patient with steatorrhea secondary to pancreatic mass as above #1 -Benadryl as needed for pruritus, cholestyramine as ordered for pruritus and steatorrhea -Acute viral hepatitis profile is negative --Total bilirubin is up to 20.4  3)BPH with LUTs--- continue Flomax , hold Avodart  Disposition/Need for in-Hospital Stay-  patient unable to be discharged at this time due to --- metastatic pancreatic cancer with painless jaundice---- transfer to [facility] for ERCP and GI/IR intervention and possible CBD stent placement  Code Status : full  Family Communication:   (patient is alert, awake and coherent)  -Left HIPAA compliant message for patient's wife at [phone] and also [phone]  Consults  :  Gi  DVT Prophylaxis  :   - SCDs /TEDs  Lab Results  Component Value Date   PLT 272 11/30/2019   Inpatient Medications  Scheduled Meds:  lipase/protease/amylase  36,000 Units Oral TID WC   senna-docusate  1 tablet Oral BID   tamsulosin  0.4 mg Oral QPC supper   Continuous Infusions:  sodium chloride 75 mL/hr at 11/30/19 1304   PRN Meds:.diphenhydrAMINE, HYDROmorphone (DILAUDID) injection, oxyCODONE, phenol   Anti-infectives (From admission, onward)   None        Objective:   Vitals:   11/30/19 2304 12/01/19 0318 12/01/19 0521 12/01/19 1447  BP: 125/62 (!) 117/58 116/62 118/60  Pulse: 70 68 65 (!) 58  Resp: 18 18 16 17   Temp: 98.8 F (37.1 C) 98.4 F (36.9 C) 98.2 F (36.8 C)   TempSrc: Oral Oral Oral   SpO2: 97% 97% 100% 98%  Weight:      Height:        Wt Readings from Last 3 Encounters:  11/29/19 70.6 kg     Intake/Output Summary (Last 24 hours) at 12/01/2019 1613 Last data filed  at 12/01/2019 0738 Gross per 24 hour  Intake 740 ml  Output 101 ml  Net 639 ml     Physical Exam  Gen:- Awake Alert, jaundiced HEENT:- [place].AT, +ve sclera icterus Neck-Supple Neck,No JVD,.  Lungs-  CTAB , fair symmetrical air movement CV- S1, S2 normal, regular  Abd-  +ve B.Sounds, Abd Soft, No tenderness,    Extremity/Skin:- No  edema, pedal pulses present Psych-affect is appropriate, oriented x3 Neuro-generalized weakness ,no new focal deficits, no tremors   Data Review:   Micro Results Recent Results (from the past 240 hour(s))  SARS CORONAVIRUS 2 (TAT 6-24 HRS) Nasopharyngeal  Nasopharyngeal Swab     Status: None   Collection Time: 11/29/19  6:05 PM   Specimen: Nasopharyngeal Swab  Result Value Ref Range Status   SARS Coronavirus 2 NEGATIVE NEGATIVE Final    Comment: (NOTE) SARS-CoV-2 target nucleic acids are NOT DETECTED. The SARS-CoV-2 RNA is generally detectable in upper and lower respiratory specimens during the acute phase of infection. Negative results do not preclude SARS-CoV-2 infection, do not rule out co-infections with other pathogens, and should not be used as the sole basis for treatment or other patient management decisions. Negative results must be combined with clinical observations, patient history, and epidemiological information. The expected result is Negative. Fact Sheet for Patients: SugarRoll.be Fact Sheet for Healthcare Providers: https://www.woods-mathews.com/ This test is not yet approved or cleared by the [place] FDA and  has been authorized for detection and/or diagnosis of SARS-CoV-2 by FDA under an Emergency Use Authorization (EUA). This EUA will remain  in effect (meaning this test can be used) for the duration of the COVID-19 declaration under Section 56 4(b)(1) of the Act, 21 U.S.C. section 360bbb-3(b)(1), unless the authorization is terminated or revoked sooner. Performed at [facility], [place] [address] [place],  [address]     Radiology Reports DG Abd 1 View  Result Date: 11/30/2019 CLINICAL DATA:  Attempted ERCP, jaundice, GERD EXAM: ABDOMEN - 1 VIEW COMPARISON:  None FLUOROSCOPY TIME:  0 minutes 26 seconds FINDINGS: CBD could not be cannulated. Visualized bowel gas in the RIGHT upper quadrant is unremarkable. No definite calcified gallstones are identified. IMPRESSION: No calcified gallstones are identified in the RIGHT upper quadrant. Electronically Signed   By: Lavonia Dana M.D.   On: 11/30/2019 17:45   CT Abdomen Pelvis W Contrast  Result Date:  11/29/2019 CLINICAL DATA:  Painless jaundice. EXAM: CT ABDOMEN AND PELVIS WITH CONTRAST TECHNIQUE: Multidetector CT imaging of the abdomen and pelvis was performed using the standard protocol following bolus administration of intravenous contrast. CONTRAST:  190m OMNIPAQUE IOHEXOL 300 MG/ML  SOLN COMPARISON:  None. FINDINGS: Lower chest: Bilateral pulmonary nodules. An irregular left lower lobe 1.9 cm nodule on 01/04. More posterior left lower lobe 1.6 cm nodule on 18/4. Normal heart size without pericardial or pleural effusion. Hepatobiliary: No focal liver lesion. Gallbladder distension, without specific evidence of acute cholecystitis. Moderate intrahepatic biliary duct dilatation. The common duct measures 1.7 cm on coronal image 40. Followed to the level of the pancreatic head, where it undergoes an abrupt cutoff. Pancreas: Pancreatic atrophy and upstream duct dilatation. This continues to the level of the pancreatic head/neck junction, which soft tissue fullness measures on the order of 2.7 x 3.0 cm on 27/2. Subtle peripancreatic edema, for which superimposed pancreatitis cannot be excluded. Example thickening of the anterior pararenal fascia on 24/2. Spleen: Too small to characterize splenic lesions are of doubtful clinical significance. There is a hyperenhancing  focus about the posterior spleen which may represent a hemangioma at 1.5 cm on 21/2. Adrenals/Urinary Tract: Normal adrenal glands. Punctate lower pole left renal collecting system calculi. Bilateral too small to characterize renal lesions. No hydronephrosis. Right bladder base 1.8 cm stone. Stomach/Bowel: Proximal gastric underdistention. Apparent gastric wall thickening, including on 18/2, is at least partially secondary. Scattered colonic diverticula.  Normal small bowel. Vascular/Lymphatic: Aortic atherosclerosis. Portal vein and splenoportal confluence involvement by tumor including on 28/2. No acute thrombus. 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 Preaortic node is not pathologic by size criteria at 8 mm on 37/2. No pelvic sidewall adenopathy. Reproductive: Normal prostate. Other: Small volume pelvic fluid. Multifocal omental thickening/nodularity, including at 1.9 cm on 43/2. Musculoskeletal: Bilateral hip degenerative changes. IMPRESSION: 1. 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 2. Moderate biliary and gallbladder distension/dilatation. No specific evidence of acute cholecystitis. 3. Possible peripancreatic edema for which pancreatitis cannot be excluded. 4. Right bladder stone without hydronephrosis. 5. Left nephrolithiasis. 6. Small volume pelvic fluid. Electronically Signed   By: Abigail Miyamoto M.D.   On: 11/29/2019 18:39   DG C-Arm 1-60 Min-No Report  Result Date: 11/30/2019 Fluoroscopy was utilized by the requesting physician.  No radiographic interpretation.     CBC Recent Labs  Lab 11/29/19 1540 11/30/19 0442  WBC 8.5 7.3  HGB 11.5* 10.3*  HCT 35.0* 31.0*  PLT 320 272  MCV 92.3 90.6  MCH 30.3 30.1  MCHC 32.9 33.2  RDW 24.0* 24.0*  LYMPHSABS  --  0.9  MONOABS  --  0.9  EOSABS  --  0.2  BASOSABS  --  0.0    Chemistries  Recent Labs  Lab 11/29/19 1540 11/30/19 0442 12/01/19 0529  NA 133* 135 136  K 4.1 3.6 3.6  CL 100 102 107  CO2 23 24 21*  GLUCOSE 107* 89 99  BUN 28* 24* 25*  CREATININE 0.86 0.67 0.62  CALCIUM 9.6 9.6 9.0  MG 1.9  --   --   AST 758* 627* 500*  ALT 425* 343* 268*  ALKPHOS 658* 596* 535*  BILITOT 19.7* 19.1* 20.4*   ------------------------------------------------------------------------------------------------------------------ No results for input(s): CHOL, HDL, LDLCALC, TRIG, CHOLHDL, LDLDIRECT in the last 72 hours.  No results found for:  HGBA1C ------------------------------------------------------------------------------------------------------------------ No results for input(s): TSH, T4TOTAL, T3FREE, THYROIDAB in the last 72 hours.  Invalid input(s): FREET3 ------------------------------------------------------------------------------------------------------------------ No results for input(s): VITAMINB12, FOLATE, FERRITIN, TIBC, IRON, RETICCTPCT in the last 72 hours.  Coagulation profile Recent Labs  Lab 11/29/19 1540 11/30/19 0442  INR 3.6* 1.5*    No results for input(s): DDIMER in the last 72 hours.  Cardiac Enzymes No results for input(s): CKMB, TROPONINI, MYOGLOBIN in the last 168 hours.  Invalid input(s): CK ------------------------------------------------------------------------------------------------------------------ No results found for: BNP   Roxan Hockey M.D on 12/01/2019 at 4:13 PM  Go to [email or web address] - for contact info  [facility] - Office  [phone]

## 2019-12-01 NOTE — Progress Notes (Signed)
  Subjective:  Patient states he enjoyed his evening meal yesterday and breakfast and lunch today.  He remains with intense pruritus.  Pruritus is generalized.  He denies abdominal pain nausea or vomiting.  He did have clay colored stool earlier today.  No melena or rectal bleeding.  Objective: Blood pressure 118/60, pulse (!) 58, temperature 98.2 F (36.8 C), temperature source Oral, resp. rate 17, height '5\' 6"'$  (1.676 m), weight 70.6 kg, SpO2 98 %. Patient is alert.  He is ambulating in the room. He remains deeply jaundiced. Abdomen is soft and nontender without organomegaly or masses. No peripheral edema noted.  Labs/studies Results:  CBC Latest Ref Rng & Units 11/30/2019 11/29/2019  WBC 4.0 - 10.5 K/uL 7.3 8.5  Hemoglobin 13.0 - 17.0 g/dL 10.3(L) 11.5(L)  Hematocrit 39.0 - 52.0 % 31.0(L) 35.0(L)  Platelets 150 - 400 K/uL 272 320    CMP Latest Ref Rng & Units 12/01/2019 11/30/2019 11/29/2019  Glucose 70 - 99 mg/dL 99 89 107(H)  BUN 8 - 23 mg/dL 25(H) 24(H) 28(H)  Creatinine 0.61 - 1.24 mg/dL 0.62 0.67 0.86  Sodium 135 - 145 mmol/L 136 135 133(L)  Potassium 3.5 - 5.1 mmol/L 3.6 3.6 4.1  Chloride 98 - 111 mmol/L 107 102 100  CO2 22 - 32 mmol/L 21(L) 24 23  Calcium 8.9 - 10.3 mg/dL 9.0 9.6 9.6  Total Protein 6.5 - 8.1 g/dL 5.1(L) 5.6(L) 6.4(L)  Total Bilirubin 0.3 - 1.2 mg/dL 20.4(HH) 19.1(HH) 19.7(HH)  Alkaline Phos 38 - 126 U/L 535(H) 596(H) 658(H)  AST 15 - 41 U/L 500(H) 627(H) 758(H)  ALT 0 - 44 U/L 268(H) 343(H) 425(H)    Hepatic Function Latest Ref Rng & Units 12/01/2019 11/30/2019 11/29/2019  Total Protein 6.5 - 8.1 g/dL 5.1(L) 5.6(L) 6.4(L)  Albumin 3.5 - 5.0 g/dL 1.9(L) 2.1(L) 2.5(L)  AST 15 - 41 U/L 500(H) 627(H) 758(H)  ALT 0 - 44 U/L 268(H) 343(H) 425(H)  Alk Phosphatase 38 - 126 U/L 535(H) 596(H) 658(H)  Total Bilirubin 0.3 - 1.2 mg/dL 20.4(HH) 19.1(HH) 19.7(HH)  Bilirubin, Direct 0.0 - 0.2 mg/dL - 14.0(H) -    CA 19-9 is 14036  Assessment:  #1.  Obstructive  jaundice secondary to pancreatic head mass which is also causing obstruction of pancreatic duct resulting in steatorrhea.  Patient underwent ERCP yesterday with bile duct could not be cannulated.  Also did not inject any contrast.  He has not had any abdominal pain. 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 Severe pruritus secondary to cholestasis.  Bilirubin keeps climbing. Patient had coagulopathy on admission which rapidly corrected with parenteral vitamin K. Discussed patient's condition with Dr. Carol Ada earlier today. Patient is to be transferred to [facility] for repeat ERCP in a.m.

## 2019-12-01 NOTE — Plan of Care (Signed)
  Problem: Education: Goal: Knowledge of General Education information will improve Description: Including pain rating scale, medication(s)/side effects and non-pharmacologic comfort measures 12/01/2019 2037 by Heloise Purpura, RN Outcome: Progressing 12/01/2019 2036 by Heloise Purpura, RN Outcome: Progressing

## 2019-12-02 ENCOUNTER — Inpatient Hospital Stay (HOSPITAL_COMMUNITY): Payer: Medicare Other | Admitting: Certified Registered Nurse Anesthetist

## 2019-12-02 ENCOUNTER — Encounter (HOSPITAL_COMMUNITY)
Admission: EM | Disposition: A | Discharge status: Home / Self Care | Payer: Self-pay | Source: Ambulatory Visit | Attending: Family Medicine

## 2019-12-02 ENCOUNTER — Inpatient Hospital Stay (HOSPITAL_COMMUNITY): Payer: Medicare Other

## 2019-12-02 DIAGNOSIS — C801 Malignant (primary) neoplasm, unspecified: Secondary | ICD-10-CM

## 2019-12-02 DIAGNOSIS — K8689 Other specified diseases of pancreas: Secondary | ICD-10-CM

## 2019-12-02 DIAGNOSIS — K831 Obstruction of bile duct: Secondary | ICD-10-CM

## 2019-12-02 DIAGNOSIS — K219 Gastro-esophageal reflux disease without esophagitis: Secondary | ICD-10-CM

## 2019-12-02 HISTORY — PX: ERCP: SHX5425

## 2019-12-02 HISTORY — PX: BILIARY STENT PLACEMENT: SHX5538

## 2019-12-02 HISTORY — PX: SPHINCTEROTOMY: SHX5544

## 2019-12-02 LAB — CBC WITH DIFFERENTIAL/PLATELET
Abs Immature Granulocytes: 0 10*3/uL (ref 0.00–0.07)
Basophils Absolute: 0.2 10*3/uL — ABNORMAL HIGH (ref 0.0–0.1)
Basophils Relative: 3 %
Eosinophils Absolute: 0.2 10*3/uL (ref 0.0–0.5)
Eosinophils Relative: 3 %
HCT: 28.4 % — ABNORMAL LOW (ref 39.0–52.0)
Hemoglobin: 9.5 g/dL — ABNORMAL LOW (ref 13.0–17.0)
Lymphocytes Relative: 6 %
Lymphs Abs: 0.4 10*3/uL — ABNORMAL LOW (ref 0.7–4.0)
MCH: 31.5 pg (ref 26.0–34.0)
MCHC: 33.5 g/dL (ref 30.0–36.0)
MCV: 94 fL (ref 80.0–100.0)
Monocytes Absolute: 0.5 10*3/uL (ref 0.1–1.0)
Monocytes Relative: 8 %
Neutro Abs: 5.4 10*3/uL (ref 1.7–7.7)
Neutrophils Relative %: 80 %
Platelets: 237 10*3/uL (ref 150–400)
RBC: 3.02 MIL/uL — ABNORMAL LOW (ref 4.22–5.81)
RDW: 24.4 % — ABNORMAL HIGH (ref 11.5–15.5)
WBC: 6.8 10*3/uL (ref 4.0–10.5)
nRBC: 0 % (ref 0.0–0.2)
nRBC: 0 /100 WBC

## 2019-12-02 LAB — MAGNESIUM: Magnesium: 1.8 mg/dL (ref 1.7–2.4)

## 2019-12-02 LAB — COMPREHENSIVE METABOLIC PANEL
ALT: 233 U/L — ABNORMAL HIGH (ref 0–44)
AST: 339 U/L — ABNORMAL HIGH (ref 15–41)
Albumin: 1.6 g/dL — ABNORMAL LOW (ref 3.5–5.0)
Alkaline Phosphatase: 512 U/L — ABNORMAL HIGH (ref 38–126)
Anion gap: 8 (ref 5–15)
BUN: 20 mg/dL (ref 8–23)
CO2: 21 mmol/L — ABNORMAL LOW (ref 22–32)
Calcium: 9 mg/dL (ref 8.9–10.3)
Chloride: 108 mmol/L (ref 98–111)
Creatinine, Ser: 0.84 mg/dL (ref 0.61–1.24)
GFR calc Af Amer: >60 mL/min (ref >60)
GFR calc non Af Amer: >60 mL/min (ref >60)
Glucose, Bld: 108 mg/dL — ABNORMAL HIGH (ref 70–99)
Potassium: 3.7 mmol/L (ref 3.5–5.1)
Sodium: 137 mmol/L (ref 135–145)
Total Bilirubin: 21.6 mg/dL (ref 0.3–1.2)
Total Protein: 4.9 g/dL — ABNORMAL LOW (ref 6.5–8.1)

## 2019-12-02 LAB — PHOSPHORUS: Phosphorus: 3.3 mg/dL (ref 2.5–4.6)

## 2019-12-02 SURGERY — ERCP, WITH INTERVENTION IF INDICATED
Anesthesia: General

## 2019-12-02 MED ORDER — CIPROFLOXACIN IN D5W 400 MG/200ML IV SOLN
INTRAVENOUS | Status: AC
  Filled 2019-12-02: qty 200

## 2019-12-02 MED ORDER — PHENYLEPHRINE HCL-NACL 10-0.9 MG/250ML-% IV SOLN
INTRAVENOUS | Status: DC | PRN
  Administered 2019-12-02: 25 ug/min via INTRAVENOUS

## 2019-12-02 MED ORDER — SODIUM CHLORIDE 0.9 % IV SOLN
INTRAVENOUS | Status: DC | PRN
  Administered 2019-12-02: 15 mL

## 2019-12-02 MED ORDER — ONDANSETRON HCL 4 MG/2ML IJ SOLN
INTRAMUSCULAR | Status: DC | PRN
  Administered 2019-12-02: 4 mg via INTRAVENOUS

## 2019-12-02 MED ORDER — ONDANSETRON HCL 4 MG/2ML IJ SOLN: 4.0000 mg | Freq: Once | INTRAMUSCULAR | Status: DC | PRN

## 2019-12-02 MED ORDER — LACTATED RINGERS IV SOLN
INTRAVENOUS | Status: AC | PRN
  Administered 2019-12-02: 1000 mL via INTRAVENOUS

## 2019-12-02 MED ORDER — FENTANYL CITRATE (PF) 100 MCG/2ML IJ SOLN: 25.0000 ug | INTRAMUSCULAR | Status: DC | PRN

## 2019-12-02 MED ORDER — EPHEDRINE SULFATE-NACL 50-0.9 MG/10ML-% IV SOSY
PREFILLED_SYRINGE | INTRAVENOUS | Status: DC | PRN
  Administered 2019-12-02: 10 mg via INTRAVENOUS

## 2019-12-02 MED ORDER — SODIUM CHLORIDE 0.9 % IV SOLN: INTRAVENOUS | Status: DC

## 2019-12-02 MED ORDER — INDOMETHACIN 50 MG RE SUPP
RECTAL | Status: AC
  Filled 2019-12-02: qty 1

## 2019-12-02 MED ORDER — SUGAMMADEX SODIUM 200 MG/2ML IV SOLN
INTRAVENOUS | Status: DC | PRN
  Administered 2019-12-02: 200 mg via INTRAVENOUS

## 2019-12-02 MED ORDER — CIPROFLOXACIN IN D5W 400 MG/200ML IV SOLN
INTRAVENOUS | Status: DC | PRN
  Administered 2019-12-02: 400 mg via INTRAVENOUS

## 2019-12-02 MED ORDER — LIDOCAINE 2% (20 MG/ML) 5 ML SYRINGE
INTRAMUSCULAR | Status: DC | PRN
  Administered 2019-12-02: 60 mg via INTRAVENOUS

## 2019-12-02 MED ORDER — FENTANYL CITRATE (PF) 100 MCG/2ML IJ SOLN
INTRAMUSCULAR | Status: DC | PRN
  Administered 2019-12-02: 50 ug via INTRAVENOUS

## 2019-12-02 MED ORDER — GLUCAGON HCL RDNA (DIAGNOSTIC) 1 MG IJ SOLR
INTRAMUSCULAR | Status: AC
  Filled 2019-12-02: qty 1

## 2019-12-02 MED ORDER — PHENYLEPHRINE 40 MCG/ML (10ML) SYRINGE FOR IV PUSH (FOR BLOOD PRESSURE SUPPORT)
PREFILLED_SYRINGE | INTRAVENOUS | Status: DC | PRN
  Administered 2019-12-02 (×2): 80 ug via INTRAVENOUS

## 2019-12-02 MED ORDER — DEXAMETHASONE SODIUM PHOSPHATE 10 MG/ML IJ SOLN
INTRAMUSCULAR | Status: DC | PRN
  Administered 2019-12-02: 10 mg via INTRAVENOUS

## 2019-12-02 MED ORDER — ROCURONIUM BROMIDE 100 MG/10ML IV SOLN
INTRAVENOUS | Status: DC | PRN
  Administered 2019-12-02: 50 mg via INTRAVENOUS

## 2019-12-02 MED ORDER — PROPOFOL 10 MG/ML IV BOLUS
INTRAVENOUS | Status: DC | PRN
  Administered 2019-12-02: 120 mg via INTRAVENOUS

## 2019-12-02 MED ORDER — INDOMETHACIN 50 MG RE SUPP
RECTAL | Status: DC | PRN
  Administered 2019-12-02: 100 mg via RECTAL

## 2019-12-02 NOTE — Op Note (Signed)
[facility] Patient Name: Jose Hale Procedure Date : 12/02/2019 MRN: [id number] Attending MD: Carol Ada , MD Date of Birth: 1941/09/01 CSN: [id number] Age: 79 Admit Type: Inpatient Procedure:                ERCP Indications:              Malignant stricture of the common bile duct Providers:                Carol Ada, MD, Carlyn Reichert, RN, Lazaro Arms,                            Technician Referring MD:              Medicines:                General Anesthesia Complications:            No immediate complications. Estimated Blood Loss:     Estimated blood loss: none. Estimated blood loss:                            none. Procedure:                Pre-Anesthesia Assessment:                           - Prior to the procedure, a History and Physical                            was performed, and patient medications and                            allergies were reviewed. The patient's tolerance of                            previous anesthesia was also reviewed. The risks                            and benefits of the procedure and the sedation                            options and risks were discussed with the patient.                            All questions were answered, and informed consent                            was obtained. Prior Anticoagulants: The patient has                            taken no previous anticoagulant or antiplatelet                            agents. ASA Grade Assessment: III - A patient with                            severe  systemic disease. After reviewing the risks                            and benefits, the patient was deemed in                            satisfactory condition to undergo the procedure.                           - Sedation was administered by an anesthesia                            professional. General anesthesia was attained.                           After obtaining informed consent, the scope was                   passed under direct vision. Throughout the                            procedure, the patient's blood pressure, pulse, and                            oxygen saturations were monitored continuously. The                            [id number] Olympus duodenoscope was                            introduced through the mouth, and used to inject                            contrast into and used to inject contrast into the                            bile duct. After obtaining informed consent, the                            scope was passed under direct vision. Throughout                            the procedure, the patient's blood pressure, pulse,                            and oxygen saturations were monitored                            continuously.The ERCP was accomplished without                            difficulty. The patient tolerated the procedure                            well. Scope In: Scope Out: Findings:  The bile duct was deeply cannulated with the short-nosed traction       sphincterotome. Contrast was injected. I personally interpreted the bile       duct images. There was brisk flow of contrast through the ducts. Image       quality was excellent. Contrast extended to the entire biliary tree. The       middle third of the main bile duct contained a single localized stenosis       10 mm in length. The major papilla was normal. A short 0.035 inch Soft       Jagwire was passed into the biliary tree. A 10 mm biliary sphincterotomy       was made with a traction (standard) sphincterotome using ERBE       electrocautery. There was no post-sphincterotomy bleeding. One 10 mm by       6 cm covered metal stent was placed 5 cm into the common bile duct. Bile       flowed through the stent. The stent was in good position.      After several short attempts, the CBD was able to be cannulated. There       was no cannulation of the PD. The guidewire was secured  in the right       intrahepatic ducts. Contrast injection showed a 1 cm stricture in the       distal portion of the mid CBD. Proximal to this point the CBD and the       intrahepatic ducts were dilated. A 10 mm x 60 cm covered metallic stent       was successfully placed. Excellent drainage was achieved. Impression:               - The major papilla appeared normal.                           - A single localized biliary stricture was found in                            the middle third of the main bile duct. The                            stricture was indeterminate.                           - A biliary sphincterotomy was performed.                           - One covered metal stent was placed into the                            common bile duct. Moderate Sedation:      None Recommendation:           - Return patient to hospital ward for ongoing care.                           - Resume regular diet.                           - If the patient is clinically stable  tomorrow he                            can be discharged home.                           - I will schedule him for an outpatient EUS with                            FNA. Procedure Code(s):        --- Professional ---                           [phone], Endoscopic retrograde                            cholangiopancreatography (ERCP); with placement of                            endoscopic stent into biliary or pancreatic duct,                            including pre- and post-dilation and guide wire                            passage, when performed, including sphincterotomy,                            when performed, each stent                           [id number], Endoscopic catheterization of the biliary                            ductal system, radiological supervision and                            interpretation Diagnosis Code(s):        --- Professional ---                           K83.1, Obstruction of bile duct CPT  copyright 2019 American Medical Association. All rights reserved. The codes documented in this report are preliminary and upon coder review may  be revised to meet current compliance requirements. Carol Ada, MD Carol Ada, MD 12/02/2019 4:04:26 PM This report has been signed electronically. Number of Addenda: 0

## 2019-12-02 NOTE — Anesthesia Preprocedure Evaluation (Signed)
Anesthesia Evaluation  Patient identified by MRN, date of birth, ID band Patient awake    Reviewed: Allergy & Precautions, NPO status , Patient's Chart, lab work & pertinent test results  Airway Mallampati: II  TM Distance: >3 FB Neck ROM: Full    Dental  (+) Teeth Intact, Dental Advisory Given, Caps   Pulmonary former smoker,    Pulmonary exam normal breath sounds clear to auscultation       Cardiovascular negative cardio ROS Normal cardiovascular exam Rhythm:Regular Rate:Normal     Neuro/Psych negative neurological ROS  negative psych ROS   GI/Hepatic GERD  Medicated and Controlled, Biliary obstruction   Endo/Other  negative endocrine ROS  Renal/GU negative Renal ROS     Musculoskeletal negative musculoskeletal ROS (+)   Abdominal   Peds  Hematology  (+) Blood dyscrasia, anemia ,   Anesthesia Other Findings Day of surgery medications reviewed with the patient.  Reproductive/Obstetrics                             Anesthesia Physical Anesthesia Plan  ASA: III  Anesthesia Plan: General   Post-op Pain Management:    Induction: Intravenous  PONV Risk Score and Plan: 2 and Ondansetron and Dexamethasone  Airway Management Planned: Oral ETT  Additional Equipment:   Intra-op Plan:   Post-operative Plan: Extubation in OR  Informed Consent: I have reviewed the patients History and Physical, chart, labs and discussed the procedure including the risks, benefits and alternatives for the proposed anesthesia with the patient or authorized representative who has indicated his/her understanding and acceptance.     Dental advisory given  Plan Discussed with: CRNA  Anesthesia Plan Comments:         Anesthesia Quick Evaluation

## 2019-12-02 NOTE — Anesthesia Procedure Notes (Signed)
Procedure Name: Intubation Date/Time: 12/02/2019 3:13 PM Performed by: Candis Shine, CRNA Pre-anesthesia Checklist: Patient identified, Emergency Drugs available, Suction available and Patient being monitored Patient Re-evaluated:Patient Re-evaluated prior to induction Oxygen Delivery Method: Circle System Utilized Preoxygenation: Pre-oxygenation with 100% oxygen Induction Type: IV induction Ventilation: Mask ventilation without difficulty Laryngoscope Size: Mac and 4 Grade View: Grade II Tube type: Oral Tube size: 7.5 mm Number of attempts: 1 Airway Equipment and Method: Stylet Placement Confirmation: ETT inserted through vocal cords under direct vision,  positive ETCO2 and breath sounds checked- equal and bilateral Secured at: 22 cm Tube secured with: Tape Dental Injury: Teeth and Oropharynx as per pre-operative assessment

## 2019-12-02 NOTE — Progress Notes (Addendum)
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  He underwent several specialist evaluations at [facility] and had general surgery, gastroenterology as well as an oncology consultation.  GI evaluated and patient went for ERCP but unfortunately the bile duct could not be cannulated so he transferred to [facility] for a second attempt and if his repeat ERCP is not successful then IR be consulted to perform a percutaneous drain with eventual internalization of a stent and an EUS may be performed to obtain tissue.  Assessment & Plan:   Principal Problem:   Hyperbilirubinemia Active Problems:   Pancreatic mass   Normocytic anemia   GERD (gastroesophageal reflux disease)  Obstructive Painless Jaundice in the setting of Metastatic Pancreatic Cancer Abnormal LFTs -T Bili continues to Trend Up and went from 19.1 -> 20.4 -> 21.6 -Underwent unsuccessful ERCP on 11/30/2019 at [facility] by Dr. Laural Golden so was transferred to [facility] for repeat ERCP and GI/IR intervention; If repeat ERCP is not successful then interventional radiology will be consulted to perform a percutaneous drain with eventual internalization of the stent and EUS may be performed to obtain tissue, most likely will need a CBD stent -CA 19-9 is 14,036 -CEA is 25.5 -INR is 1.5 on admission was, albumin is 1.6 today -Patient's  AST went from 758 -> 627 -> 500 -> 339 -Patient's ALT went from 425 -> 343 -> 268 -> 233 -Alk Phos went from 658 -> 596 -> 535 -> 512 -Currently getting normal saline at 75 MLS per hour and will continue for now -Continue with Creon 36,000 units p.o. 3 times daily with meals -Patient is to undergo repeat ERCP today and is currently n.p.o.  -Patient has oxycodone 5 mg p.o. every 6 as needed for moderate pain and 1 mg IV every 2 as needed for severe pain -Patient is atorvastatin 40 mg p.o. daily currently being held due to elevated LFTs -Appreciate further consultative services from gastroenterology and further evaluation recommendations -Will need a PET Scan as an outpatient or a CT Scan of the chest per Oncology -Dr. Delton Coombes also recommended Germline Mutation Testing as an outpatient   Hyperbilirubinemia/Persistent Pruritus -Patient with steatorrhea secondary to pancreatic mass as above #1 -Benadryl 25 mg p.o. every 6 as needed for pruritus, cholestyramine 4 g p.o. 3 times daily ordered for pruritus and steatorrhea -Acute viral hepatitis profile is negative -Total bilirubin is up to 21.4  BPH with LUTs -Continue Tamsulosin 0.4 mg po Daily after supper -Continue to hold dutasteride 0.5 mg p.o. daily  Hyperlipidemia -Currently his atorvastatin 40 g p.o. daily and TriCor 145 mg p.o. daily has been held  GERD -Once he is taking n.p.o. we will resume his home omeprazole with p.o. Protonix substitution  History of Cataracts -Currently takes moxifloxacin 0.5% ophthalmic solution 1 drop in both eyes as needed, MURO 128 2 % ophthalmic solution 1 drop in both eyes as needed, as well as Prolensa 0.07% 1  Drop Both Eyes PRN  Normocytic Anemia -Suspect anemia of chronic disease in the setting of his malignancy -Patient's hemoglobin/hematocrit went from 10.3/31.0 is now 9.5/28.4 -Check anemia panel in the a.m. -Continue to monitor for signs and symptoms of bleeding; currently no overt  bleeding noted -Repeat CBC in a.m.  Metabolic Acidosis -Mild as CO2 was 21, chloride level was 108, and anion gap was 8 -Patient is on IV fluid hydration with normal saline at a rate of 75 MLS per hour -Continue to monitor and trend -Repeat CMP in a.m.  DVT prophylaxis: SCDs Code Status: FULL CODE  Family Communication: No family present at bedside and attempted to update the wife via Telephone but went to VM  Disposition Plan: Pending further clinical evaluation by Gastroenterology   Consultants:   Gastroenterology  Had Consults of GI and Oncology at [facility]    Procedures:  Attempted ERCP   Antimicrobials:  Anti-infectives (From admission, onward)   None     Subjective: Seen and examined at bedside and states that he had a bowel movement and thinks his stool is a little better color today.  Complains of some abdominal distention and some mild abdominal discomfort that comes and goes..  No nausea or vomiting but patient states that he is very hungry and thirsty.  Was surprised at how fast this has progressed.  No chest pain, lightheadedness or other concerns or complaints at this time.  Objective: Vitals:   12/01/19 1447 12/01/19 1557 12/01/19 2042 12/02/19 0508  BP: 118/60  137/66 118/64  Pulse: (!) 58 (!) 58 65 68  Resp: 17  17 17   Temp:   97.9 F (36.6 C) 98.4 F (36.9 C)  TempSrc:   Oral Oral  SpO2: 98% 98% 100% 99%  Weight:      Height:        Intake/Output Summary (Last 24 hours) at 12/02/2019 1200 Last data filed at 12/02/2019 4825 Gross per 24 hour  Intake 0 ml  Output --  Net 0 ml   Filed Weights   11/29/19 1454 11/29/19 2137  Weight: 69.4 kg 70.6 kg   Examination: Physical Exam:  Constitutional: WN/WD overweight Caucasian male who is significantly jaundiced and currently in NAD and appears calm but does appear slightly uncomfortable Eyes: Lids and conjunctivae normal, sclerae are icteric ENMT: External Ears, Nose appear normal. Grossly normal  hearing.  Neck: Appears normal, supple, no cervical masses, normal ROM, no appreciable thyromegaly; no JVD Respiratory: Diminished to auscultation bilaterally, no wheezing, rales, rhonchi or crackles. Normal respiratory effort and patient is not tachypenic. No accessory muscle use.  Unlabored breathing Cardiovascular: RRR, no murmurs / rubs / gallops. S1 and S2 auscultated. No extremity edema.  Abdomen: Soft, minimally tender, distended secondary body habitus. Bowel sounds positive.  GU: Deferred. Musculoskeletal: No clubbing / cyanosis of digits/nails. No joint deformity upper and lower extremities.  Skin: No rashes, lesions, ulcers on a limited skin evaluation. No induration; Warm and dry.  Neurologic: CN 2-12 grossly intact with no focal deficits. Romberg sign and cerebellar reflexes not assessed.  Psychiatric: Normal judgment and insight. Alert and oriented x 3. Mildly anxious mood and appropriate affect.   Data Reviewed: I have personally reviewed following labs and imaging studies  CBC: Recent Labs  Lab 11/29/19 1540 11/30/19 0442 12/02/19 0902  WBC 8.5 7.3 6.8  NEUTROABS  --  5.2 5.4  HGB 11.5* 10.3* 9.5*  HCT 35.0* 31.0* 28.4*  MCV 92.3 90.6 94.0  PLT 320 272 237  Basic Metabolic Panel: Recent Labs  Lab 11/29/19 1540 11/30/19 0442 12/01/19 0529 12/02/19 0902  NA 133* 135 136 137  K 4.1 3.6 3.6 3.7  CL 100 102 107 108  CO2 23 24 21* 21*  GLUCOSE 107* 89 99 108*  BUN 28* 24* 25* 20  CREATININE 0.86 0.67 0.62 0.84  CALCIUM 9.6 9.6 9.0 9.0  MG 1.9  --   --  1.8  PHOS 3.1  --   --  3.3   GFR: Estimated Creatinine Clearance: 66.5 mL/min (by C-G formula based on SCr of 0.84 mg/dL). Liver Function Tests: Recent Labs  Lab 11/29/19 1540 11/30/19 0442 12/01/19 0529 12/02/19 0902  AST 758* 627* 500* 339*  ALT 425* 343* 268* 233*  ALKPHOS 658* 596* 535* 512*  BILITOT 19.7* 19.1* 20.4* 21.6*  PROT 6.4* 5.6* 5.1* 4.9*  ALBUMIN 2.5* 2.1* 1.9* 1.6*   Recent Labs    Lab 11/29/19 1540  LIPASE 40   No results for input(s): AMMONIA in the last 168 hours. Coagulation Profile: Recent Labs  Lab 11/29/19 1540 11/30/19 0442  INR 3.6* 1.5*   Cardiac Enzymes: No results for input(s): CKTOTAL, CKMB, CKMBINDEX, TROPONINI in the last 168 hours. BNP (last 3 results) No results for input(s): PROBNP in the last 8760 hours. HbA1C: No results for input(s): HGBA1C in the last 72 hours. CBG: No results for input(s): GLUCAP in the last 168 hours. Lipid Profile: No results for input(s): CHOL, HDL, LDLCALC, TRIG, CHOLHDL, LDLDIRECT in the last 72 hours. Thyroid Function Tests: No results for input(s): TSH, T4TOTAL, FREET4, T3FREE, THYROIDAB in the last 72 hours. Anemia Panel: No results for input(s): VITAMINB12, FOLATE, FERRITIN, TIBC, IRON, RETICCTPCT in the last 72 hours. Sepsis Labs: No results for input(s): PROCALCITON, LATICACIDVEN in the last 168 hours.  Recent Results (from the past 240 hour(s))  SARS CORONAVIRUS 2 (TAT 6-24 HRS) Nasopharyngeal Nasopharyngeal Swab     Status: None   Collection Time: 11/29/19  6:05 PM   Specimen: Nasopharyngeal Swab  Result Value Ref Range Status   SARS Coronavirus 2 NEGATIVE NEGATIVE Final    Comment: (NOTE) SARS-CoV-2 target nucleic acids are NOT DETECTED. The SARS-CoV-2 RNA is generally detectable in upper and lower respiratory specimens during the acute phase of infection. Negative results do not preclude SARS-CoV-2 infection, do not rule out co-infections with other pathogens, and should not be used as the sole basis for treatment or other patient management decisions. Negative results must be combined with clinical observations, patient history, and epidemiological information. The expected result is Negative. Fact Sheet for Patients: SugarRoll.be Fact Sheet for Healthcare Providers: https://www.woods-mathews.com/ This test is not yet approved or cleared by the  [place] FDA and  has been authorized for detection and/or diagnosis of SARS-CoV-2 by FDA under an Emergency Use Authorization (EUA). This EUA will remain  in effect (meaning this test can be used) for the duration of the COVID-19 declaration under Section 56 4(b)(1) of the Act, 21 U.S.C. section 360bbb-3(b)(1), unless the authorization is terminated or revoked sooner. Performed at [facility], [place] [address], [place], [place] [address]     Radiology Studies: DG Abd 1 View  Result Date: 11/30/2019 CLINICAL DATA:  Attempted ERCP, jaundice, GERD EXAM: ABDOMEN - 1 VIEW COMPARISON:  None FLUOROSCOPY TIME:  0 minutes 26 seconds FINDINGS: CBD could not be cannulated. Visualized bowel gas in the RIGHT upper quadrant is unremarkable. No definite calcified gallstones are identified. IMPRESSION: No calcified gallstones are identified in the RIGHT  upper quadrant. Electronically Signed   By: Lavonia Dana M.D.   On: 11/30/2019 17:45   DG C-Arm 1-60 Min-No Report  Result Date: 11/30/2019 Fluoroscopy was utilized by the requesting physician.  No radiographic interpretation.   Scheduled Meds: . cholestyramine  4 g Oral TID  . lipase/protease/amylase  36,000 Units Oral TID WC  . senna-docusate  1 tablet Oral BID  . tamsulosin  0.4 mg Oral QPC supper   Continuous Infusions: . sodium chloride 75 mL/hr at 12/01/19 1738    LOS: 3 days   Kerney Elbe, DO [facility] PAGER is on AMION  If 7PM-7AM, please contact night-coverage [email or web address]

## 2019-12-02 NOTE — Interval H&P Note (Signed)
History and Physical Interval Note:  12/02/2019 3:05 PM  Jose Hale  has presented today for surgery, with the diagnosis of Biliary obstruction.  The various methods of treatment have been discussed with the patient and family. After consideration of risks, benefits and other options for treatment, the patient has consented to  Procedure(s): ENDOSCOPIC RETROGRADE CHOLANGIOPANCREATOGRAPHY (ERCP) (N/A) as a surgical intervention.  The patient's history has been reviewed, patient examined, no change in status, stable for surgery.  I have reviewed the patient's chart and labs.  Questions were answered to the patient's satisfaction.     Merit Gadsby D

## 2019-12-02 NOTE — Transfer of Care (Signed)
Immediate Anesthesia Transfer of Care Note  Patient: Jose Hale  Procedure(s) Performed: ENDOSCOPIC RETROGRADE CHOLANGIOPANCREATOGRAPHY (ERCP) (N/A ) SPHINCTEROTOMY BILIARY STENT PLACEMENT  Patient Location: Endoscopy Unit  Anesthesia Type:General  Level of Consciousness: awake, alert  and oriented  Airway & Oxygen Therapy: Patient Spontanous Breathing  Post-op Assessment: Report given to RN and Post -op Vital signs reviewed and stable  Post vital signs: Reviewed and stable  Last Vitals:  Vitals Value Taken Time  BP    Temp    Pulse    Resp    SpO2      Last Pain:  Vitals:   12/02/19 1425  TempSrc: Temporal  PainSc: 4       Patients Stated Pain Goal: 0 ([id number])  Complications: No apparent anesthesia complications

## 2019-12-03 ENCOUNTER — Inpatient Hospital Stay (HOSPITAL_COMMUNITY): Payer: Medicare Other

## 2019-12-03 DIAGNOSIS — D649 Anemia, unspecified: Secondary | ICD-10-CM

## 2019-12-03 DIAGNOSIS — Z9889 Other specified postprocedural states: Secondary | ICD-10-CM

## 2019-12-03 DIAGNOSIS — M7989 Other specified soft tissue disorders: Secondary | ICD-10-CM

## 2019-12-03 DIAGNOSIS — R609 Edema, unspecified: Secondary | ICD-10-CM

## 2019-12-03 LAB — COMPREHENSIVE METABOLIC PANEL
ALT: 218 U/L — ABNORMAL HIGH (ref 0–44)
AST: 259 U/L — ABNORMAL HIGH (ref 15–41)
Albumin: 1.6 g/dL — ABNORMAL LOW (ref 3.5–5.0)
Alkaline Phosphatase: 514 U/L — ABNORMAL HIGH (ref 38–126)
Anion gap: 8 (ref 5–15)
BUN: 21 mg/dL (ref 8–23)
CO2: 21 mmol/L — ABNORMAL LOW (ref 22–32)
Calcium: 8.9 mg/dL (ref 8.9–10.3)
Chloride: 108 mmol/L (ref 98–111)
Creatinine, Ser: 0.88 mg/dL (ref 0.61–1.24)
GFR calc Af Amer: >60 mL/min (ref >60)
GFR calc non Af Amer: >60 mL/min (ref >60)
Glucose, Bld: 149 mg/dL — ABNORMAL HIGH (ref 70–99)
Potassium: 4.1 mmol/L (ref 3.5–5.1)
Sodium: 137 mmol/L (ref 135–145)
Total Bilirubin: 18.1 mg/dL (ref 0.3–1.2)
Total Protein: 5 g/dL — ABNORMAL LOW (ref 6.5–8.1)

## 2019-12-03 LAB — CBC WITH DIFFERENTIAL/PLATELET
Abs Immature Granulocytes: 0.2 10*3/uL — ABNORMAL HIGH (ref 0.00–0.07)
Basophils Absolute: 0 10*3/uL (ref 0.0–0.1)
Basophils Relative: 0 %
Eosinophils Absolute: 0 10*3/uL (ref 0.0–0.5)
Eosinophils Relative: 0 %
HCT: 27.3 % — ABNORMAL LOW (ref 39.0–52.0)
Hemoglobin: 9.1 g/dL — ABNORMAL LOW (ref 13.0–17.0)
Immature Granulocytes: 3 %
Lymphocytes Relative: 6 %
Lymphs Abs: 0.4 10*3/uL — ABNORMAL LOW (ref 0.7–4.0)
MCH: 30.7 pg (ref 26.0–34.0)
MCHC: 33.3 g/dL (ref 30.0–36.0)
MCV: 92.2 fL (ref 80.0–100.0)
Monocytes Absolute: 0.3 10*3/uL (ref 0.1–1.0)
Monocytes Relative: 5 %
Neutro Abs: 5.1 10*3/uL (ref 1.7–7.7)
Neutrophils Relative %: 86 %
Platelets: 228 10*3/uL (ref 150–400)
RBC: 2.96 MIL/uL — ABNORMAL LOW (ref 4.22–5.81)
RDW: 24.1 % — ABNORMAL HIGH (ref 11.5–15.5)
WBC: 6 10*3/uL (ref 4.0–10.5)
nRBC: 0 % (ref 0.0–0.2)

## 2019-12-03 LAB — PHOSPHORUS: Phosphorus: 4.2 mg/dL (ref 2.5–4.6)

## 2019-12-03 LAB — MAGNESIUM: Magnesium: 1.9 mg/dL (ref 1.7–2.4)

## 2019-12-03 MED ORDER — FUROSEMIDE 10 MG/ML IJ SOLN
40.0000 mg | Freq: Once | INTRAMUSCULAR | Status: AC
  Administered 2019-12-03: 13:00:00 40 mg via INTRAVENOUS
  Filled 2019-12-03: qty 4

## 2019-12-03 MED ORDER — DIPHENHYDRAMINE HCL 25 MG PO CAPS: 25.0000 mg | ORAL_CAPSULE | Freq: Four times a day (QID) | ORAL | 0 refills | Status: AC | PRN

## 2019-12-03 MED ORDER — SENNOSIDES-DOCUSATE SODIUM 8.6-50 MG PO TABS: 1.0000 | ORAL_TABLET | Freq: Every day | ORAL | 0 refills | Status: AC

## 2019-12-03 MED ORDER — CHOLESTYRAMINE 4 G PO PACK: 4.0000 g | PACK | Freq: Three times a day (TID) | ORAL | 12 refills | Status: AC

## 2019-12-03 MED ORDER — ATORVASTATIN CALCIUM 40 MG PO TABS: 40.0000 mg | ORAL_TABLET | Freq: Every day | ORAL | Status: DC

## 2019-12-03 MED ORDER — OXYCODONE HCL 5 MG PO TABS: 5.0000 mg | ORAL_TABLET | Freq: Four times a day (QID) | ORAL | 0 refills | Status: AC | PRN

## 2019-12-03 MED ORDER — PANCRELIPASE (LIP-PROT-AMYL) 36000-114000 UNITS PO CPEP: 36000.0000 [IU] | ORAL_CAPSULE | Freq: Three times a day (TID) | ORAL | 0 refills | Status: AC

## 2019-12-03 NOTE — Anesthesia Postprocedure Evaluation (Signed)
Anesthesia Post Note  Patient: Jose Hale  Procedure(s) Performed: ENDOSCOPIC RETROGRADE CHOLANGIOPANCREATOGRAPHY (ERCP) (N/A ) [place]     Patient location during evaluation: PACU Anesthesia Type: General Level of consciousness: awake and alert Pain management: pain level controlled Vital Signs Assessment: post-procedure vital signs reviewed and stable Respiratory status: spontaneous breathing, nonlabored ventilation and respiratory function stable Cardiovascular status: blood pressure returned to baseline and stable Postop Assessment: no apparent nausea or vomiting Anesthetic complications: no    Last Vitals:  Vitals:   12/02/19 2123 12/03/19 0526  BP: 116/73 (!) 146/67  Pulse: 76 (!) 53  Resp: 15 16  Temp: 36.9 C (!) 36.4 C  SpO2: 99% 100%    Last Pain:  Vitals:   12/03/19 0756  TempSrc:   PainSc: 0-No pain                 Catalina Gravel

## 2019-12-03 NOTE — Progress Notes (Signed)
NUTRITION NOTE RD working remotely.  Page received from RN around 1425. Patient admitted on 2/24. Consult placed today at 1119 for assessment of nutrition requirements and needs and diet education. Patient on a Regular diet and ate 75% of breakfast and 75% of lunch today (total of 614 kcal, 28 grams protein).   Noted order for 36000 units creon TID. Please make sure that patient is taking creon 30 minutes prior to eating for medication to be the most effective.   Per chart review, weight yesterday was 156 lb. PTA, the most recently documented weight was on 02/25/18 when he weighed 155 lb. This indicates stable weight for >1.5 years.   No nutrition needs at this time. Discharge order entered for discharge home today. Should further nutrition-related needs arise, recommend outpatient RD consult ([facility] on [employer]).      Jarome Matin, MS, RD, LDN, CNSC Inpatient Clinical Dietitian RD pager # available in [place]  After hours/weekend pager # available in [facility]

## 2019-12-03 NOTE — Progress Notes (Signed)
Dietician called for consult.

## 2019-12-03 NOTE — Progress Notes (Signed)
LEV has been completed.   Preliminary results in CV Proc.   Abram Sander 12/03/2019 12:23 PM

## 2019-12-03 NOTE — Progress Notes (Signed)
Patient was discharged. Patient was provided d/c instructions. IV was removed and documented.

## 2019-12-03 NOTE — Discharge Summary (Signed)
Physician Discharge Summary  Jose Hale VHQ:[id number] DOB: 02/02/1941 DOA: 11/29/2019  PCP: Quentin Cornwall, MD  Admit date: 11/29/2019 Discharge date: 12/03/2019  Admitted From: Home Disposition: Home  Recommendations for Outpatient Follow-up:  1. Follow up with PCP in 1-2 weeks 2. Follow up with Gastroenterolgy Dr. Benson Norway within 1-2 weeks 3. Follow up with Oncology Dr. Delton Coombes  4. Please obtain CMP/CBC, Mag, Phos in one week 5. 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  He underwent several specialist evaluations at [facility] and had general surgery, gastroenterology as well as an oncology consultation.  GI evaluated and patient went for ERCP but unfortunately the bile duct could not be cannulated so he transferred to [facility] for a second attempt and if his repeat ERCP is not successful then IR be consulted to perform a percutaneous drain with eventual internalization of a stent and an EUS may be performed to obtain tissue.  Patient was unable to undergo a successful ERCP stent placement and will follow up with gastroenterology in the outpatient setting for an EUS and biopsy.  His T bili and his LFTs started improving and gastroenterology said was okay to discharge the  patient and follow-up closely.  Prior to discharge patient noted that he had some lower extremity swelling and his lower extremity duplex was done and showed no DVT.  Likely it was in the setting of his poor nutritional status with a low albumin as well as the amount of fluid that he received while he was hospitalized.  He was given a dose of IV Lasix and nutritionist was consulted and he is deemed stable for discharge and will need to follow-up with PCP, gastroenterology as well as oncology in outpatient setting.  Discharge Diagnoses:  Principal Problem:   Hyperbilirubinemia Active Problems:   Pancreatic mass   Normocytic anemia   GERD (gastroesophageal reflux disease)  Obstructive Painless Jaundice in the setting of MetastaticPancreaticCancer Abnormal LFTs -T Bili continues to Trend Up and went from 19.1 -> 20.4 -> 21.6 -> 18.1 -Underwent unsuccessful ERCP on 11/30/2019 at [facility] by Dr. Laural Golden so [facility] andGI/IR intervention; If repeat ERCP is not successful then interventional radiology will be consulted to perform a percutaneous drain with eventual internalization of the stent and EUS may be performed to obtain tissue, most likely will need a CBD stent -Underwent successful ERCP with stent placement which was metal and GI recommended continuing regular diet and will continue Creon and have the patient follow-up for an outpatient EUS with fine-needle aspiration biopsy -CA 19-9 [id number] -CEA is 25.5 -INR is 1.5 on admission was,albumin is 1.6 again  -Patient's AST went from 758 -> 627 -> 500 -> 339 -> 259 -Patient's ALT went from 425 -> 343 -> 268 -> 233 -> 218 -Alk Phos went from 658 -> 596 -> 535 ->  512 -> 514 -Currently getting normal saline at 75 MLS per hour and will continue for now -Continue with Creon 36,000 units p.o. 3 times daily with meals -Patient is to undergo repeat ERCP today and is currently n.p.o.  -Patient has oxycodone 5 mg  p.o. every 6 as needed for moderate pain and 1 mg IV every 2 as needed for severe pain -Patient is atorvastatin 40 mg p.o. daily currently being held due to elevated LFTs -Appreciate further consultative services from gastroenterology and further evaluation recommendations -Will need a PET Scan as an outpatient or a CT Scan of the chest per Oncology -Dr. 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 every 6 as needed for pruritus, cholestyramine 4 g p.o. 3 times daily ordered for pruritus and steatorrhea -Acute viral hepatitis profile is negative -Total bilirubin is up to 21.4 and has now trended down after stent placement is 18.1  BPH with LUTs -Continue Tamsulosin 0.4 mg po Daily after supper -Continue to hold dutasteride 0.5 mg p.o. daily hospitalized but resume at discharge  Hyperlipidemia -Currently his atorvastatin 40 g p.o. daily and TriCor 145 mg p.o. daily has been held; okay to resume his TriCor but will continue to hold his atorvastatin given his abnormal LFTs and resume within 1 week if his LFTs are improved  GERD -Resume his home omeprazole at discharge  History of Cataracts -Currently takes moxifloxacin 0.5% ophthalmic solution 1 drop in both eyes as needed, MURO 128 2 % ophthalmic solution 1 drop in both eyes as needed, as well as Prolensa 0.07% 1 Drop Both Eyes PRN  Normocytic Anemia -Suspect anemia of chronic disease in the setting of his malignancy -Patient's hemoglobin/hematocrit went from 10.3/31.0 is now 9.1/27.3 -Check anemia panel in the outpatient setting -Continue to monitor for signs and symptoms of bleeding; currently no overt bleeding noted -Repeat CBC within 1 week  Metabolic  Acidosis -Mild as CO2 was 21, chloride level was 108, and anion gap was 8 -Patient is on IV fluid hydration with normal saline at a rate of 75 MLS per hour this fluid has now been stopped -Continue to monitor and trend -Repeat CMP within 1 week  Lower extremity swelling -In the setting of his low albumin and poor nutritional status -Check the patient for lower extremity duplex that was negative for DVT -Given a dose of IV Lasix prior to discharge  -Consulted nutrition for further evaluation  -Continue monitor and trend in the outpatient setting  Discharge Instructions  Discharge Instructions    Call MD for:  difficulty breathing, headache or visual disturbances   Complete by: As directed    Call MD for:  extreme fatigue   Complete by: As directed    Call MD for:  hives   Complete by: As directed    Call MD for:  persistant dizziness or light-headedness   Complete by: As directed    Call MD for:  persistant nausea and vomiting   Complete by: As directed    Call MD for:  redness, tenderness, or signs of infection (pain, swelling, redness, odor or green/yellow discharge around incision site)   Complete by: As directed    Call MD for:  severe uncontrolled pain   Complete by: As directed    Call MD for:  temperature >100.4   Complete by: As  directed    Diet - low sodium heart healthy   Complete by: As directed    Low Fat Diet   Discharge instructions   Complete by: As directed    You were cared for by a hospitalist during your hospital stay. If you have any questions about your discharge medications or the care you received while you were in the hospital after you are discharged, you can call the unit and ask to speak with the hospitalist on call if the hospitalist that took care of you is not available. Once you are discharged, your primary care physician will handle any further medical issues. Please note that NO REFILLS for any discharge medications will be authorized once you are  discharged, as it is imperative that you return to your primary care physician (or establish a relationship with a primary care physician if you do not have one) for your aftercare needs so that they can reassess your need for medications and monitor your lab values.  Follow up with PCP, Gastroenterology, and Hematology/Oncology. Take all medications as prescribed. If symptoms change or worsen please return to the ED for evaluation   Increase activity slowly   Complete by: As directed      Allergies as of 12/03/2019   No Known Allergies     Medication List    TAKE these medications   aspirin 81 MG chewable tablet Chew 81 mg by mouth at bedtime.   atorvastatin 40 MG tablet Commonly known as: LIPITOR Take 1 tablet (40 mg total) by mouth daily. Resume when LFTs are improved and ok with GI Start taking on: December 10, 2019 What changed:   additional instructions  These instructions start on December 10, 2019. If you are unsure what to do until then, ask your doctor or other care provider.   cholestyramine 4 g packet Commonly known as: QUESTRAN Take 1 packet (4 g total) by mouth 3 (three) times daily.   diphenhydrAMINE 25 mg capsule Commonly known as: BENADRYL Take 1 capsule (25 mg total) by mouth every 6 (six) hours as needed for itching or allergies.   dutasteride 0.5 MG capsule Commonly known as: AVODART Take 0.5 mg by mouth daily.   esomeprazole 40 MG capsule Commonly known as: NEXIUM Take 40 mg by mouth every morning.   lipase/protease/amylase 36000 UNITS Cpep capsule Commonly known as: CREON Take 1 capsule (36,000 Units total) by mouth 3 (three) times daily with meals.   moxifloxacin 0.5 % ophthalmic solution Commonly known as: VIGAMOX Place 1 drop into both eyes daily as needed.   multivitamin capsule Take 1 capsule by mouth daily.   Muro 128 2 % ophthalmic solution Generic drug: sodium chloride Place 1 drop into both eyes daily as needed.   oxyCODONE 5 MG  immediate release tablet Commonly known as: Oxy IR/ROXICODONE Take 1 tablet (5 mg total) by mouth every 6 (six) hours as needed for moderate pain or breakthrough pain.   Prolensa 0.07 % Soln Generic drug: Bromfenac Sodium Place 1 drop into both eyes daily as needed.   senna-docusate 8.6-50 MG tablet Commonly known as: Senokot-S Take 1 tablet by mouth at bedtime.   tamsulosin 0.4 MG Caps capsule Commonly known as: FLOMAX Take 0.4 mg by mouth daily.   Tricor 145 MG tablet Generic drug: fenofibrate Take 145 mg by mouth daily.   Zylet 0.5-0.3 % Susp Generic drug: Loteprednol-Tobramycin Place 1 drop into both eyes daily as needed.      Follow-up Information  Quentin Cornwall, MD. Call.   Specialty: Family Medicine Why: Follow up within 1-2 weeks  Contact information: [address] [place] [address] [phone]        Carol Ada, MD. Call.   Specialty: Gastroenterology Why: Follow up within 1-2 weeks for EUS and Biopsy  Contact information: [place], [place] [address] [phone]          No Known Allergies  Consultations:  Gastroenterology  Had Consults of GI and Oncology at [facility]   Procedures/Studies: DG Abd 1 View  Result Date: 11/30/2019 CLINICAL DATA:  Attempted ERCP, jaundice, GERD EXAM: ABDOMEN - 1 VIEW COMPARISON:  None FLUOROSCOPY TIME:  0 minutes 26 seconds FINDINGS: CBD could not be cannulated. Visualized bowel gas in the RIGHT upper quadrant is unremarkable. No definite calcified gallstones are identified. IMPRESSION: No calcified gallstones are identified in the RIGHT upper quadrant. Electronically Signed   By: Lavonia Dana M.D.   On: 11/30/2019 17:45   CT Abdomen Pelvis W Contrast  Result Date: 11/29/2019 CLINICAL DATA:  Painless jaundice. EXAM: CT ABDOMEN AND PELVIS WITH CONTRAST TECHNIQUE: Multidetector CT imaging of the abdomen and pelvis was performed using the standard protocol following  bolus administration of intravenous contrast. CONTRAST:  131m OMNIPAQUE IOHEXOL 300 MG/ML  SOLN COMPARISON:  None. FINDINGS: Lower chest: Bilateral pulmonary nodules. An irregular left lower lobe 1.9 cm nodule on 01/04. More posterior left lower lobe 1.6 cm nodule on 18/4. Normal heart size without pericardial or pleural effusion. Hepatobiliary: No focal liver lesion. Gallbladder distension, without specific evidence of acute cholecystitis. Moderate intrahepatic biliary duct dilatation. The common duct measures 1.7 cm on coronal image 40. Followed to the level of the pancreatic head, where it undergoes an abrupt cutoff. Pancreas: Pancreatic atrophy and upstream duct dilatation. This continues to the level of the pancreatic head/neck junction, which soft tissue fullness measures on the order of 2.7 x 3.0 cm on 27/2. Subtle peripancreatic edema, for which superimposed pancreatitis cannot be excluded. Example thickening of the anterior pararenal fascia on 24/2. Spleen: Too small to characterize splenic lesions are of doubtful clinical significance. There is a hyperenhancing focus about the posterior spleen which may represent a hemangioma at 1.5 cm on 21/2. Adrenals/Urinary Tract: Normal adrenal glands. Punctate lower pole left renal collecting system calculi. Bilateral too small to characterize renal lesions. No hydronephrosis. Right bladder base 1.8 cm stone. Stomach/Bowel: Proximal gastric underdistention. Apparent gastric wall thickening, including on 18/2, is at least partially secondary. Scattered colonic diverticula.  Normal small bowel. Vascular/Lymphatic: Aortic atherosclerosis. Portal vein and splenoportal confluence involvement by tumor including on 28/2. No acute thrombus. 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 Preaortic node is not pathologic by size criteria at 8 mm on 37/2. No pelvic sidewall adenopathy. Reproductive: Normal prostate. Other: Small volume pelvic fluid.  Multifocal omental thickening/nodularity, including at 1.9 cm on 43/2. Musculoskeletal: Bilateral hip degenerative changes. IMPRESSION: 1. 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 2. Moderate biliary and gallbladder distension/dilatation. No specific evidence of acute cholecystitis. 3. Possible peripancreatic edema for which pancreatitis cannot be excluded. 4. Right bladder stone without hydronephrosis. 5. Left nephrolithiasis. 6. Small volume pelvic fluid. Electronically Signed   By: KAbigail MiyamotoM.D.   On: 11/29/2019 18:39   DG ERCP BILIARY & PANCREATIC DUCTS  Result Date: 12/02/2019 CLINICAL DATA:  Jaundice, painless. CT suggests metastatic pancreatic adenocarcinoma. EXAM: ERCP TECHNIQUE: Multiple spot  images obtained with the fluoroscopic device and submitted for interpretation post-procedure. COMPARISON:  CT 11/29/2019 FINDINGS: A series of fluoroscopic spot images document endoscopic cannulation and opacification of the CBD. High-grade stenosis in the distal CBD. Proximal CBD and central visualized intrahepatic ducts are dilated. The intrahepatic biliary tree is incompletely visualized. Subsequent images document placement of a metallic biliary stent across the distal CBD into the duodenum, with persistent tapered narrowing in the midportion of the stent on the final image. IMPRESSION: 1. Distal CBD high-grade stenosis/occlusion, with endoscopic metallic biliary stent placement. These images were submitted for radiologic interpretation only. Please see the procedural report for the amount of contrast and the fluoroscopy time utilized. Electronically Signed   By: Lucrezia Europe M.D.   On: 12/02/2019 16:04   DG C-Arm 1-60 Min-No Report  Result Date: 11/30/2019 Fluoroscopy was utilized by the requesting physician.  No radiographic interpretation.   VAS [place] LOWER EXTREMITY VENOUS  (DVT)  Result Date: 12/03/2019  Lower Venous DVTStudy Indications: Swelling, and Edema.  Comparison Study: no prior Performing Technologist: Abram Sander RVS  Examination Guidelines: A complete evaluation includes B-mode imaging, spectral Doppler, color Doppler, and power Doppler as needed of all accessible portions of each vessel. Bilateral testing is considered an integral part of a complete examination. Limited examinations for reoccurring indications may be performed as noted. The reflux portion of the exam is performed with the patient in reverse Trendelenburg.  +---------+---------------+---------+-----------+----------+--------------+ RIGHT    CompressibilityPhasicitySpontaneityPropertiesThrombus Aging +---------+---------------+---------+-----------+----------+--------------+ CFV      Full           Yes      Yes                                 +---------+---------------+---------+-----------+----------+--------------+ SFJ      Full                                                        +---------+---------------+---------+-----------+----------+--------------+ FV Prox  Full                                                        +---------+---------------+---------+-----------+----------+--------------+ FV Mid   Full                                                        +---------+---------------+---------+-----------+----------+--------------+ FV DistalFull                                                        +---------+---------------+---------+-----------+----------+--------------+ PFV      Full                                                        +---------+---------------+---------+-----------+----------+--------------+  POP      Full           Yes      Yes                                 +---------+---------------+---------+-----------+----------+--------------+ PTV      Full                                                         +---------+---------------+---------+-----------+----------+--------------+ PERO     Full                                                        +---------+---------------+---------+-----------+----------+--------------+   +---------+---------------+---------+-----------+----------+--------------+ LEFT     CompressibilityPhasicitySpontaneityPropertiesThrombus Aging +---------+---------------+---------+-----------+----------+--------------+ CFV      Full           Yes      Yes                                 +---------+---------------+---------+-----------+----------+--------------+ SFJ      Full                                                        +---------+---------------+---------+-----------+----------+--------------+ FV Prox  Full                                                        +---------+---------------+---------+-----------+----------+--------------+ FV Mid   Full                                                        +---------+---------------+---------+-----------+----------+--------------+ FV DistalFull                                                        +---------+---------------+---------+-----------+----------+--------------+ PFV      Full                                                        +---------+---------------+---------+-----------+----------+--------------+ POP      Full           Yes      Yes                                 +---------+---------------+---------+-----------+----------+--------------+  PTV      Full                                                        +---------+---------------+---------+-----------+----------+--------------+ PERO     Full                                                        +---------+---------------+---------+-----------+----------+--------------+     Summary: BILATERAL: - No evidence of deep vein thrombosis seen in the lower extremities, bilaterally.   *See table(s)  above for measurements and observations. Electronically signed by Servando Snare MD on 12/03/2019 at 3:57:28 PM.    Final     ERCP Findings:      The bile duct was deeply cannulated with the short-nosed traction       sphincterotome. Contrast was injected. I personally interpreted the bile       duct images. There was brisk flow of contrast through the ducts. Image       quality was excellent. Contrast extended to the entire biliary tree. The       middle third of the main bile duct contained a single localized stenosis       10 mm in length. The major papilla was normal. A short 0.035 inch Soft       Jagwire was passed into the biliary tree. A 10 mm biliary sphincterotomy       was made with a traction (standard) sphincterotome using ERBE       electrocautery. There was no post-sphincterotomy bleeding. One 10 mm by       6 cm covered metal stent was placed 5 cm into the common bile duct. Bile       flowed through the stent. The stent was in good position.      After several short attempts, the CBD was able to be cannulated. There       was no cannulation of the PD. The guidewire was secured in the right       intrahepatic ducts. Contrast injection showed a 1 cm stricture in the       distal portion of the mid CBD. Proximal to this point the CBD and the       intrahepatic ducts were dilated. A 10 mm x 60 cm covered metallic stent       was successfully placed. Excellent drainage was achieved. Impression:               - The major papilla appeared normal.                           - A single localized biliary stricture was found in                            the middle third of the main bile duct. The                            stricture was indeterminate.                           -  A biliary sphincterotomy was performed.                           - One covered metal stent was placed into the                            common bile duct. Moderate Sedation:      None Recommendation:            - Return patient to hospital ward for ongoing care.                           - Resume regular diet.                           - If the patient is clinically stable tomorrow he                            can be discharged home.                           - I will schedule him for an outpatient EUS with                            FNA.  Subjective: Seen and examined at bedside and is feeling better.  No nausea or vomiting.  Has some mild discomfort in his abdomen earlier but none now.  Ambulating without issues.  Stable for discharge per GI perspective and will need to follow-up for his EUS with fine-needle aspiration on Friday.  Discharge Exam: Vitals:   12/03/19 0526 12/03/19 1506  BP: (!) 146/67 119/62  Pulse: (!) 53 61  Resp: 16 17  Temp: (!) 97.5 F (36.4 C) 98 F (36.7 C)  SpO2: 100% 100%   Vitals:   12/02/19 1642 12/02/19 2123 12/03/19 0526 12/03/19 1506  BP: (!) 148/74 116/73 (!) 146/67 119/62  Pulse: 64 76 (!) 53 61  Resp: '18 15 16 17  '$ Temp: 98.3 F (36.8 C) 98.4 F (36.9 C) (!) 97.5 F (36.4 C) 98 F (36.7 C)  TempSrc: Oral Oral Oral Oral  SpO2: 100% 99% 100% 100%  Weight:      Height:       General: Pt is an elderly jaundiced Caucasian male alert, awake, not in acute distress Cardiovascular: RRR, S1/S2 +, no rubs, no gallops Respiratory: CTA bilaterally, no wheezing, no rhonchi: Unlabored breathing Abdominal: Soft, NT, mildly distended, bowel sounds + Extremities: 1+ lower extremity edema bilaterally with right worse than left slightly , no cyanosis  The results of significant diagnostics from this hospitalization (including imaging, microbiology, ancillary and laboratory) are listed below for reference.    Microbiology: Recent Results (from the past 240 hour(s))  SARS CORONAVIRUS 2 (TAT 6-24 HRS) Nasopharyngeal Nasopharyngeal Swab     Status: None   Collection Time: 11/29/19  6:05 PM   Specimen: Nasopharyngeal Swab  Result Value Ref Range Status   SARS  Coronavirus 2 NEGATIVE NEGATIVE Final    Comment: (NOTE) SARS-CoV-2 target nucleic acids are NOT DETECTED. The SARS-CoV-2 RNA is generally detectable in upper and lower respiratory specimens during the acute phase of infection. Negative results do not preclude SARS-CoV-2 infection, do not rule out co-infections  with other pathogens, and should not be used as the sole basis for treatment or other patient management decisions. Negative results must be combined with clinical observations, patient history, and epidemiological information. The expected result is Negative. Fact Sheet for Patients: SugarRoll.be Fact Sheet for Healthcare Providers: https://www.woods-mathews.com/ This test is not yet approved or cleared by the [place] FDA and  has been authorized for detection and/or diagnosis of SARS-CoV-2 by FDA under an Emergency Use Authorization (EUA). This EUA will remain  in effect (meaning this test can be used) for the duration of the COVID-19 declaration under Section 56 4(b)(1) of the Act, 21 U.S.C. section 360bbb-3(b)(1), unless the authorization is terminated or revoked sooner. Performed at [facility], [place] [address], [place], [place] [address]    Labs: BNP (last 3 results) No results for input(s): BNP in the last 8760 hours. Basic Metabolic Panel: Recent Labs  Lab 11/29/19 1540 11/30/19 0442 12/01/19 0529 12/02/19 0902 12/03/19 0501  NA 133* 135 136 137 137  K 4.1 3.6 3.6 3.7 4.1  CL 100 102 107 108 108  CO2 23 24 21* 21* 21*  GLUCOSE 107* 89 99 108* 149*  BUN 28* 24* 25* 20 21  CREATININE 0.86 0.67 0.62 0.84 0.88  CALCIUM 9.6 9.6 9.0 9.0 8.9  MG 1.9  --   --  1.8 1.9  PHOS 3.1  --   --  3.3 4.2   Liver Function Tests: Recent Labs  Lab 11/29/19 1540 11/30/19 0442 12/01/19 0529 12/02/19 0902 12/03/19 0501  AST 758* 627* 500* 339* 259*  ALT 425* 343* 268* 233* 218*  ALKPHOS 658* 596* 535* 512* 514*   BILITOT 19.7* 19.1* 20.4* 21.6* 18.1*  PROT 6.4* 5.6* 5.1* 4.9* 5.0*  ALBUMIN 2.5* 2.1* 1.9* 1.6* 1.6*   Recent Labs  Lab 11/29/19 1540  LIPASE 40   No results for input(s): AMMONIA in the last 168 hours. CBC: Recent Labs  Lab 11/29/19 1540 11/30/19 0442 12/02/19 0902 12/03/19 0501  WBC 8.5 7.3 6.8 6.0  NEUTROABS  --  5.2 5.4 5.1  HGB 11.5* 10.3* 9.5* 9.1*  HCT 35.0* 31.0* 28.4* 27.3*  MCV 92.3 90.6 94.0 92.2  PLT 320 272 237 228   Cardiac Enzymes: No results for input(s): CKTOTAL, CKMB, CKMBINDEX, TROPONINI in the last 168 hours. BNP: Invalid input(s): POCBNP CBG: No results for input(s): GLUCAP in the last 168 hours. D-Dimer No results for input(s): DDIMER in the last 72 hours. Hgb A1c No results for input(s): HGBA1C in the last 72 hours. Lipid Profile No results for input(s): CHOL, HDL, LDLCALC, TRIG, CHOLHDL, LDLDIRECT in the last 72 hours. Thyroid function studies No results for input(s): TSH, T4TOTAL, T3FREE, THYROIDAB in the last 72 hours.  Invalid input(s): FREET3 Anemia work up No results for input(s): VITAMINB12, FOLATE, FERRITIN, TIBC, IRON, RETICCTPCT in the last 72 hours. Urinalysis    Component Value Date/Time   COLORURINE AMBER (A) 11/29/2019 1828   APPEARANCEUR CLEAR 11/29/2019 1828   LABSPEC >1.046 (H) 11/29/2019 1828   PHURINE 5.0 11/29/2019 1828   GLUCOSEU NEGATIVE 11/29/2019 1828   HGBUR MODERATE (A) 11/29/2019 1828   BILIRUBINUR MODERATE (A) 11/29/2019 1828   KETONESUR NEGATIVE 11/29/2019 1828   PROTEINUR NEGATIVE 11/29/2019 1828   NITRITE NEGATIVE 11/29/2019 1828   LEUKOCYTESUR NEGATIVE 11/29/2019 1828   Sepsis Labs Invalid input(s): PROCALCITONIN,  WBC,  LACTICIDVEN Microbiology Recent Results (from the past 240 hour(s))  SARS CORONAVIRUS 2 (TAT 6-24 HRS) Nasopharyngeal Nasopharyngeal Swab  Status: None   Collection Time: 11/29/19  6:05 PM   Specimen: Nasopharyngeal Swab  Result Value Ref Range Status   SARS Coronavirus 2  NEGATIVE NEGATIVE Final    Comment: (NOTE) SARS-CoV-2 target nucleic acids are NOT DETECTED. The SARS-CoV-2 RNA is generally detectable in upper and lower respiratory specimens during the acute phase of infection. Negative results do not preclude SARS-CoV-2 infection, do not rule out co-infections with other pathogens, and should not be used as the sole basis for treatment or other patient management decisions. Negative results must be combined with clinical observations, patient history, and epidemiological information. The expected result is Negative. Fact Sheet for Patients: SugarRoll.be Fact Sheet for Healthcare Providers: https://www.woods-mathews.com/ This test is not yet approved or cleared by the [place] FDA and  has been authorized for detection and/or diagnosis of SARS-CoV-2 by FDA under an Emergency Use Authorization (EUA). This EUA will remain  in effect (meaning this test can be used) for the duration of the COVID-19 declaration under Section 56 4(b)(1) of the Act, 21 U.S.C. section 360bbb-3(b)(1), unless the authorization is terminated or revoked sooner. Performed at [facility], [place] [address] [place], [place] [address]    Time coordinating discharge: 35 minutes  SIGNED:  Kerney Elbe, DO [facility] 12/03/2019, 6:59 PM Pager is on [place]  If 7PM-7AM, please contact night-coverage [email or web address] Password [email or web address]

## 2019-12-05 ENCOUNTER — Other Ambulatory Visit: Payer: Self-pay | Admitting: Gastroenterology

## 2019-12-07 ENCOUNTER — Other Ambulatory Visit (HOSPITAL_COMMUNITY)
Admission: RE | Admit: 2019-12-07 | Discharge: 2019-12-07 | Disposition: A | Discharge status: Home / Self Care | Payer: Medicare Other | Source: Ambulatory Visit | Attending: Gastroenterology | Admitting: Gastroenterology

## 2019-12-07 DIAGNOSIS — Z01812 Encounter for preprocedural laboratory examination: Secondary | ICD-10-CM | POA: Diagnosis present

## 2019-12-07 DIAGNOSIS — Z20822 Contact with and (suspected) exposure to covid-19: Secondary | ICD-10-CM | POA: Diagnosis not present

## 2019-12-07 LAB — SARS CORONAVIRUS 2 (TAT 6-24 HRS): SARS Coronavirus 2: NEGATIVE

## 2019-12-09 ENCOUNTER — Ambulatory Visit (HOSPITAL_COMMUNITY): Payer: Medicare Other | Admitting: Anesthesiology

## 2019-12-09 ENCOUNTER — Encounter (HOSPITAL_COMMUNITY)
Admission: RE | Disposition: A | Discharge status: Home / Self Care | Payer: Self-pay | Source: Home / Self Care | Attending: Gastroenterology

## 2019-12-09 ENCOUNTER — Other Ambulatory Visit: Payer: Self-pay

## 2019-12-09 ENCOUNTER — Encounter (HOSPITAL_COMMUNITY): Payer: Self-pay | Admitting: Gastroenterology

## 2019-12-09 ENCOUNTER — Ambulatory Visit (HOSPITAL_COMMUNITY)
Admission: RE | Admit: 2019-12-09 | Discharge: 2019-12-09 | Disposition: A | Discharge status: Home / Self Care | Payer: Medicare Other | Attending: Gastroenterology | Admitting: Gastroenterology

## 2019-12-09 DIAGNOSIS — R634 Abnormal weight loss: Secondary | ICD-10-CM | POA: Insufficient documentation

## 2019-12-09 DIAGNOSIS — Z87891 Personal history of nicotine dependence: Secondary | ICD-10-CM | POA: Diagnosis not present

## 2019-12-09 DIAGNOSIS — K831 Obstruction of bile duct: Secondary | ICD-10-CM | POA: Insufficient documentation

## 2019-12-09 DIAGNOSIS — K869 Disease of pancreas, unspecified: Secondary | ICD-10-CM | POA: Diagnosis present

## 2019-12-09 DIAGNOSIS — R17 Unspecified jaundice: Secondary | ICD-10-CM | POA: Diagnosis not present

## 2019-12-09 DIAGNOSIS — K8689 Other specified diseases of pancreas: Secondary | ICD-10-CM | POA: Diagnosis not present

## 2019-12-09 HISTORY — PX: ESOPHAGOGASTRODUODENOSCOPY (EGD) WITH PROPOFOL: SHX5813

## 2019-12-09 HISTORY — PX: UPPER ESOPHAGEAL ENDOSCOPIC ULTRASOUND (EUS): SHX6562

## 2019-12-09 HISTORY — PX: FINE NEEDLE ASPIRATION: SHX5430

## 2019-12-09 SURGERY — UPPER ESOPHAGEAL ENDOSCOPIC ULTRASOUND (EUS)
Anesthesia: Monitor Anesthesia Care

## 2019-12-09 MED ORDER — LACTATED RINGERS IV SOLN: INTRAVENOUS | Status: DC

## 2019-12-09 MED ORDER — PROPOFOL 10 MG/ML IV BOLUS
INTRAVENOUS | Status: AC
  Filled 2019-12-09: qty 20

## 2019-12-09 MED ORDER — EPHEDRINE SULFATE-NACL 50-0.9 MG/10ML-% IV SOSY
PREFILLED_SYRINGE | INTRAVENOUS | Status: DC | PRN
  Administered 2019-12-09: 10 mg via INTRAVENOUS
  Administered 2019-12-09: 5 mg via INTRAVENOUS
  Administered 2019-12-09 (×2): 10 mg via INTRAVENOUS

## 2019-12-09 MED ORDER — PROPOFOL 10 MG/ML IV BOLUS
INTRAVENOUS | Status: AC
  Filled 2019-12-09: qty 40

## 2019-12-09 MED ORDER — PROPOFOL 500 MG/50ML IV EMUL
INTRAVENOUS | Status: DC | PRN
  Administered 2019-12-09: 110 ug/kg/min via INTRAVENOUS
  Administered 2019-12-09: 80 ug/kg/min via INTRAVENOUS

## 2019-12-09 MED ORDER — LIDOCAINE 2% (20 MG/ML) 5 ML SYRINGE
INTRAMUSCULAR | Status: DC | PRN
  Administered 2019-12-09: 50 mg via INTRAVENOUS

## 2019-12-09 MED ORDER — SODIUM CHLORIDE 0.9 % IV SOLN: INTRAVENOUS | Status: DC

## 2019-12-09 MED ORDER — PROPOFOL 10 MG/ML IV BOLUS
INTRAVENOUS | Status: DC | PRN
  Administered 2019-12-09 (×6): 10 mg via INTRAVENOUS
  Administered 2019-12-09: 30 mg via INTRAVENOUS

## 2019-12-09 NOTE — Op Note (Addendum)
[facility] Patient Name: Jose Hale Procedure Date: 12/09/2019 MRN: [id number] Attending MD: Carol Ada , MD Date of Birth: 07/19/1941 CSN: [id number] Age: 79 Admit Type: Outpatient Procedure:                Upper EUS Indications:              Suspected mass in pancreas on CT scan Providers:                Carol Ada, MD, Kary Kos, RN, Theodora Blow,                            Technician Referring MD:              Medicines:                Propofol per Anesthesia Complications:            No immediate complications. Estimated Blood Loss:     Estimated blood loss was minimal. Procedure:                Pre-Anesthesia Assessment:                           - Prior to the procedure, a History and Physical                            was performed, and patient medications and                            allergies were reviewed. The patient's tolerance of                            previous anesthesia was also reviewed. The risks                            and benefits of the procedure and the sedation                            options and risks were discussed with the patient.                            All questions were answered, and informed consent                            was obtained. Prior Anticoagulants: The patient has                            taken no previous anticoagulant or antiplatelet                            agents. ASA Grade Assessment: III - A patient with                            severe systemic disease. After reviewing the risks  and benefits, the patient was deemed in                            satisfactory condition to undergo the procedure.                           - Sedation was administered by an anesthesia                            professional. Deep sedation was attained.                           After obtaining informed consent, the endoscope was                            passed under direct vision.  Throughout the                            procedure, the patient's blood pressure, pulse, and                            oxygen saturations were monitored continuously. The                            [id number] Olympus Linear EUS was                            introduced through the mouth, and advanced to the                            second part of duodenum. The upper EUS was                            technically difficult and complex. The patient                            tolerated the procedure well. Scope In: Scope Out: Findings:      ENDOSONOGRAPHIC FINDING: :      An irregular mass was identified in the pancreatic head. The mass was       hypoechoic. The mass measured 27 mm by 30 mm in maximal cross-sectional       diameter. The endosonographic borders were poorly-defined. An intact       interface was seen between the mass and the superior mesenteric artery       and celiac trunk suggesting a lack of invasion. The remainder of the       pancreas was examined. The endosonographic appearance of parenchyma and       the upstream pancreatic duct indicated duct dilation, a tortuous/ectatic       duct and parenchymal atrophy. Fine needle aspiration for cytology was       performed. Color Doppler imaging was utilized prior to needle puncture       to confirm a lack of significant vascular structures within the needle       path. Five passes were made with the 25 gauge needle using a  transduodenal approach. A stylet was used. A cytologist was present and       performed a preliminary cytologic examination. The cellularity of the       specimen was adequate. Final cytology results are pending.      One stent was visualized endosonographically in the common bile duct.       Extension of the stent was noted in the common bile duct.      Moderate hyperechoic material consistent with sludge was visualized       endosonographically in the gallbladder body.      The mass in  the head of the pancreas was amorphous. It was very       difficult to define and artifact from the metallic stent hindered       visualization. This region was very vascular and it was difficult to       find a safe window to FNA. A narrow window was obtained and five passes       with the 25 gauge FNA needle was performed. A peripancreatic and a       periaortic lymph node (1 cm x 1 cm) were identified and significant       vasculature overlied these lymph nodes precluding the ability to sample       the lymph nodes. Ectatic vasculature was also noted around the portal       confluence region and the anatomy was distorted. The gallbladder was       visualized and there was evidence of sludge. The pancreas body and tail       were atrophied and the PD was markedly dilated as well as ectatic. Impression:               - A mass was identified in the pancreatic head. N2.                            Fine needle aspiration performed.                           - One stent was visualized endosonographically in                            the common bile duct.                           - Hyperechoic material consistent with sludge was                            visualized endosonographically in the gallbladder                            body. Moderate Sedation:      Not Applicable - Patient had care per Anesthesia. Recommendation:           - Patient has a contact number available for                            emergencies. The signs and symptoms of potential                            delayed complications were  discussed with the                            patient. Return to normal activities tomorrow.                            Written discharge instructions were provided to the                            patient.                           - Resume regular diet.                           - Await cytology results.                           - If the cytology is nonconclusive a repeat EUS                             will be discussed with the patient. Procedure Code(s):        --- Professional ---                           [phone], Esophagogastroduodenoscopy, flexible,                            transoral; with transendoscopic ultrasound-guided                            intramural or transmural fine needle                            aspiration/biopsy(s), (includes endoscopic                            ultrasound examination limited to the esophagus,                            stomach or duodenum, and adjacent structures) Diagnosis Code(s):        --- Professional ---                           K86.89, Other specified diseases of pancreas                           K83.8, Other specified diseases of biliary tract                           R93.3, Abnormal findings on diagnostic imaging of                            other parts of digestive tract CPT copyright 2019 American Medical Association. All rights reserved. The codes documented in this report are preliminary and upon coder review may  be revised to meet current compliance requirements. Saralyn Pilar  Benson Norway, MD Carol Ada, MD 12/09/2019 12:37:37 PM This report has been signed electronically. Number of Addenda: 0

## 2019-12-09 NOTE — Interval H&P Note (Signed)
History and Physical Interval Note:  12/09/2019 10:59 AM  Jose Hale  has presented today for surgery, with the diagnosis of pancreatic cancer.  The various methods of treatment have been discussed with the patient and family. After consideration of risks, benefits and other options for treatment, the patient has consented to  Procedure(s): UPPER ESOPHAGEAL ENDOSCOPIC ULTRASOUND (EUS) (N/A) as a surgical intervention.  The patient's history has been reviewed, patient examined, no change in status, stable for surgery.  I have reviewed the patient's chart and labs.  Questions were answered to the patient's satisfaction.     Viraj Liby D

## 2019-12-09 NOTE — Anesthesia Preprocedure Evaluation (Signed)
Anesthesia Evaluation  Patient identified by MRN, date of birth, ID band Patient awake    Reviewed: Allergy & Precautions, NPO status , Patient's Chart, lab work & pertinent test results  Airway Mallampati: II  TM Distance: >3 FB Neck ROM: Full    Dental  (+) Teeth Intact, Dental Advisory Given, Caps   Pulmonary former smoker,    Pulmonary exam normal breath sounds clear to auscultation       Cardiovascular negative cardio ROS Normal cardiovascular exam Rhythm:Regular Rate:Normal     Neuro/Psych negative neurological ROS  negative psych ROS   GI/Hepatic GERD  Medicated and Controlled, Biliary obstruction   Endo/Other  negative endocrine ROS  Renal/GU negative Renal ROS     Musculoskeletal negative musculoskeletal ROS (+)   Abdominal Normal abdominal exam  (+)   Peds  Hematology  (+) Blood dyscrasia, anemia ,   Anesthesia Other Findings Day of surgery medications reviewed with the patient.  Reproductive/Obstetrics                             Anesthesia Physical  Anesthesia Plan  ASA: III  Anesthesia Plan: MAC   Post-op Pain Management:    Induction:   PONV Risk Score and Plan: 2 and Propofol infusion  Airway Management Planned: Natural Airway and Mask  Additional Equipment: None  Intra-op Plan:   Post-operative Plan:   Informed Consent: I have reviewed the patients History and Physical, chart, labs and discussed the procedure including the risks, benefits and alternatives for the proposed anesthesia with the patient or authorized representative who has indicated his/her understanding and acceptance.     Dental advisory given  Plan Discussed with: CRNA  Anesthesia Plan Comments:         Anesthesia Quick Evaluation

## 2019-12-09 NOTE — Transfer of Care (Signed)
Immediate Anesthesia Transfer of Care Note  Patient: Jose Hale  Procedure(s) Performed: Procedure(s): UPPER ESOPHAGEAL ENDOSCOPIC ULTRASOUND (EUS) (N/A) FINE NEEDLE ASPIRATION (FNA) LINEAR (N/A)  Patient Location: PACU  Anesthesia Type:MAC  Level of Consciousness:  sedated, patient cooperative and responds to stimulation  Airway & Oxygen Therapy:Patient Spontanous Breathing and Patient connected to face mask oxgen  Post-op Assessment:  Report given to PACU RN and Post -op Vital signs reviewed and stable  Post vital signs:  Reviewed and stable  Last Vitals:  Vitals:   12/09/19 1033  BP: (!) 115/59  Pulse: (!) 56  Resp: 13  Temp: 36.4 C  SpO2: 177%    Complications: No apparent anesthesia complications

## 2019-12-09 NOTE — Discharge Instructions (Signed)

## 2019-12-14 LAB — CYTOLOGY - NON PAP

## 2019-12-14 NOTE — Anesthesia Postprocedure Evaluation (Signed)
Anesthesia Post Note  Patient: Kelson Queenan  Procedure(s) Performed: UPPER ESOPHAGEAL ENDOSCOPIC ULTRASOUND (EUS) (N/A ) FINE NEEDLE ASPIRATION (FNA) LINEAR (N/A ) ESOPHAGOGASTRODUODENOSCOPY (EGD) WITH PROPOFOL (N/A )     Patient location during evaluation: Endoscopy Anesthesia Type: MAC Level of consciousness: awake Pain management: pain level controlled Vital Signs Assessment: post-procedure vital signs reviewed and stable Respiratory status: spontaneous breathing Cardiovascular status: stable Postop Assessment: no apparent nausea or vomiting Anesthetic complications: no    Last Vitals:  Vitals:   12/09/19 1233 12/09/19 1241  BP: (!) 102/47 (!) 101/42  Pulse: 69 66  Resp: (!) 23 19  Temp: (!) 35.9 C   SpO2: 95% 99%    Last Pain:  Vitals:   12/12/19 1336  TempSrc:   PainSc: 0-No pain   Pain Goal:                   Huston Foley

## 2020-03-16 IMAGING — CT CT ABD-PELV W/ CM
2 of 5 series · 15 of 46 positions shown, 17 images · IV contrast (Omnipaque or Isovue)
Comparison: None.

CLINICAL DATA: Painless jaundice.

EXAM:
CT ABDOMEN AND PELVIS WITH CONTRAST
TECHNIQUE: Multidetector CT imaging of the abdomen and pelvis was performed
using the standard protocol following bolus administration of
intravenous contrast.
CONTRAST:  100mL OMNIPAQUE IOHEXOL 300 MG/ML  SOLN

[Series 2: axial st · axial · 0.73mm/px · z∈[+878,+1263]mm · 12 of 89 slices shown, 14 images]
[im 6/89  soft-tissue]
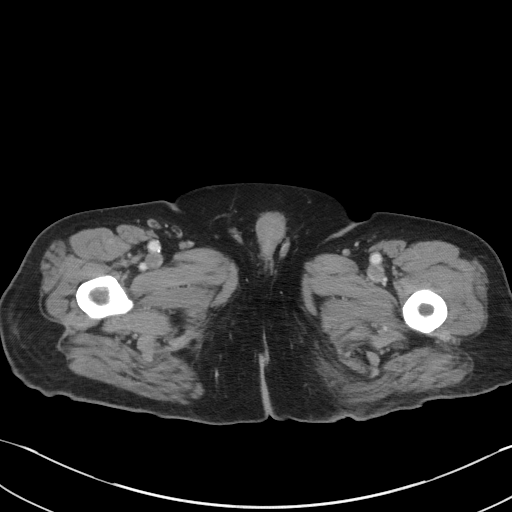
[im 6/89  bone]
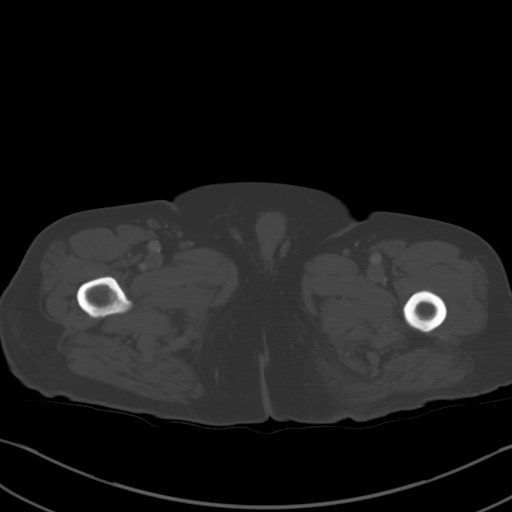
[im 12/89  soft-tissue]
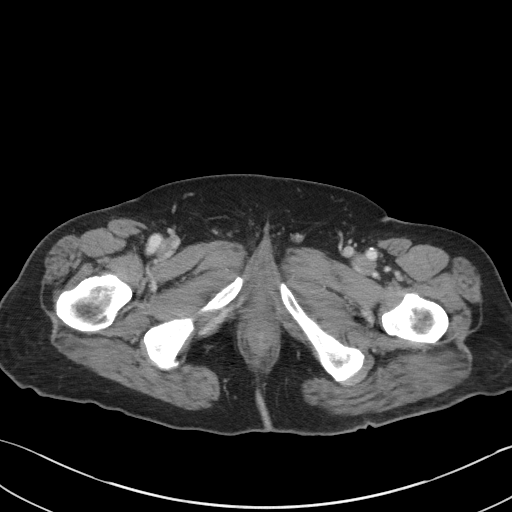
[im 18/89  soft-tissue]
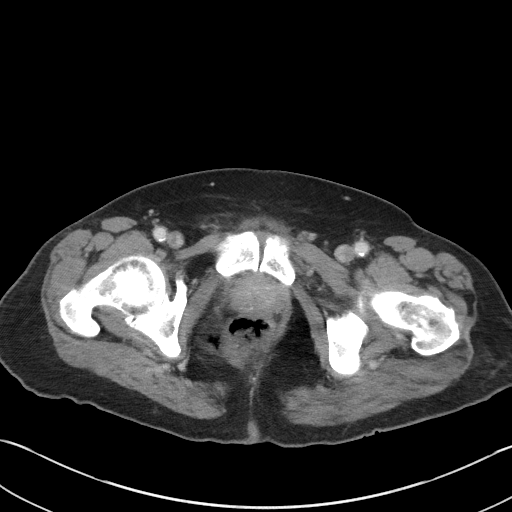
[im 30/89  soft-tissue]
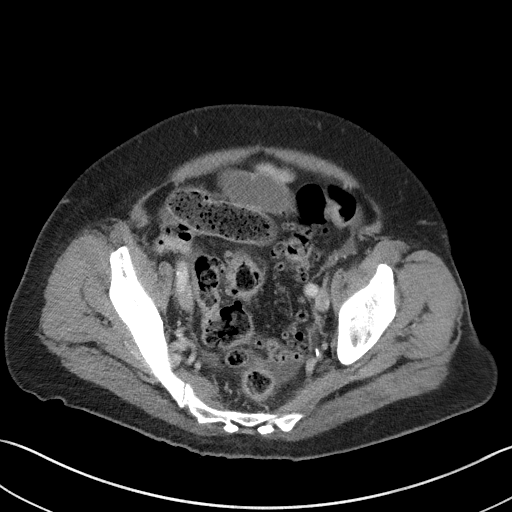
[im 36/89  soft-tissue]
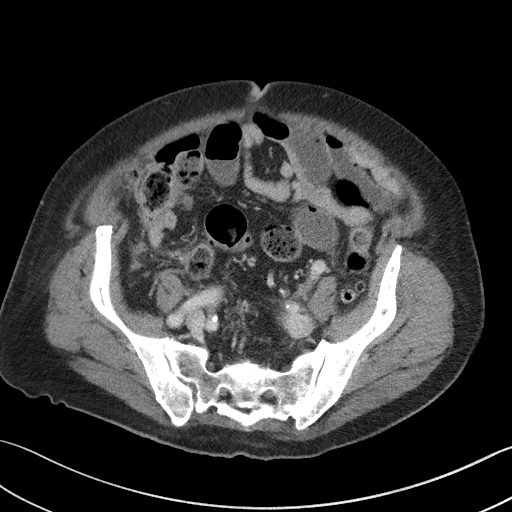
[im 42/89  soft-tissue]
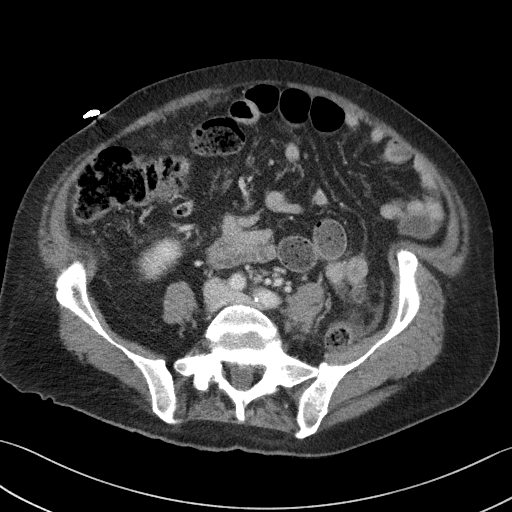
[im 47/89  soft-tissue]
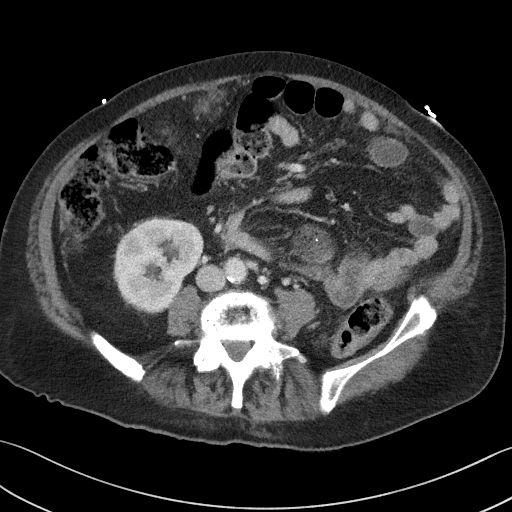
[im 53/89  soft-tissue]
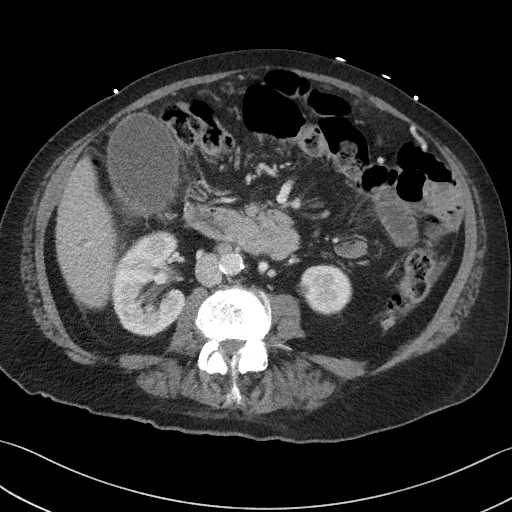
[im 59/89  soft-tissue]
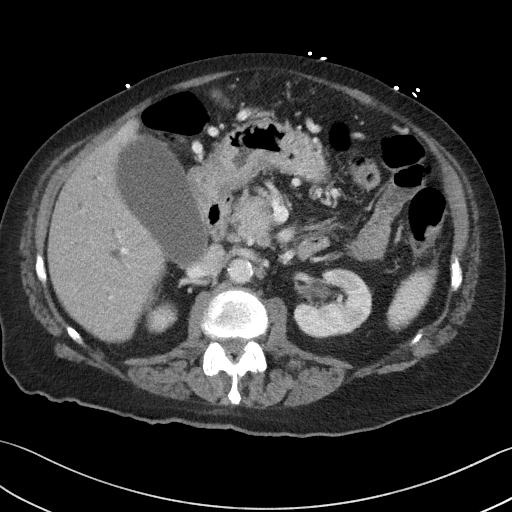
[im 59/89  bone]
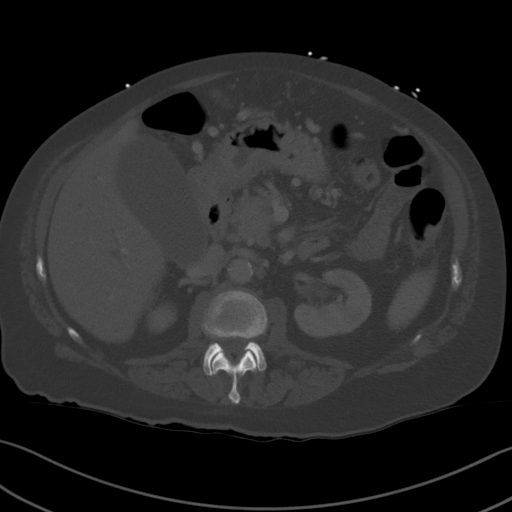
[im 71/89  soft-tissue]
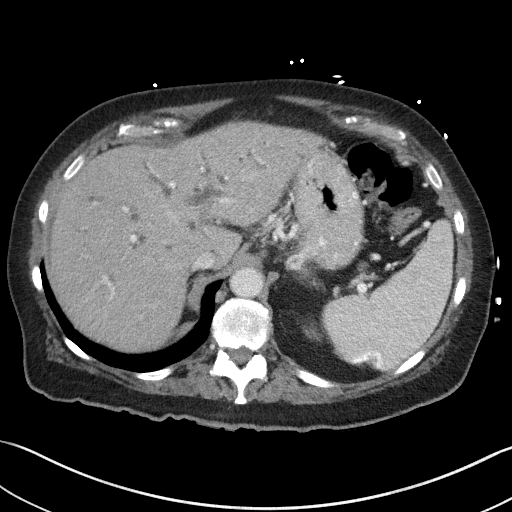
[im 77/89  soft-tissue]
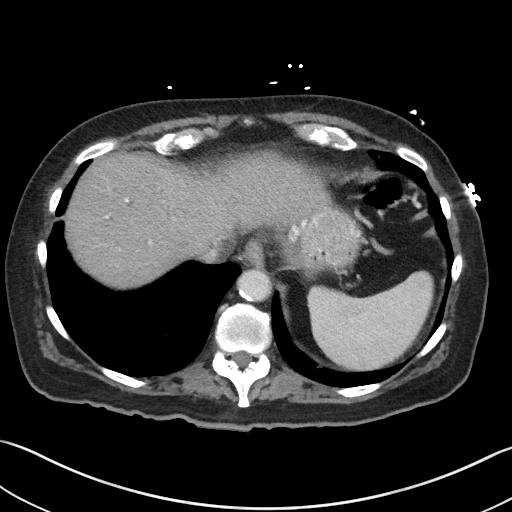
[im 83/89  soft-tissue]
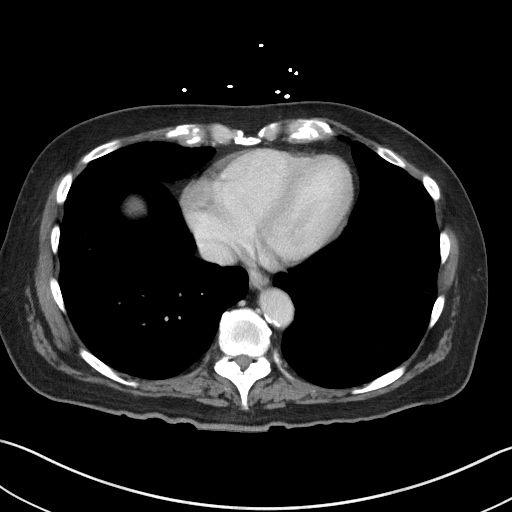

[Series 5: coronal st · coronal · 0.71mm/px · 3 of 97 slices shown]
[im 33/97  soft-tissue]
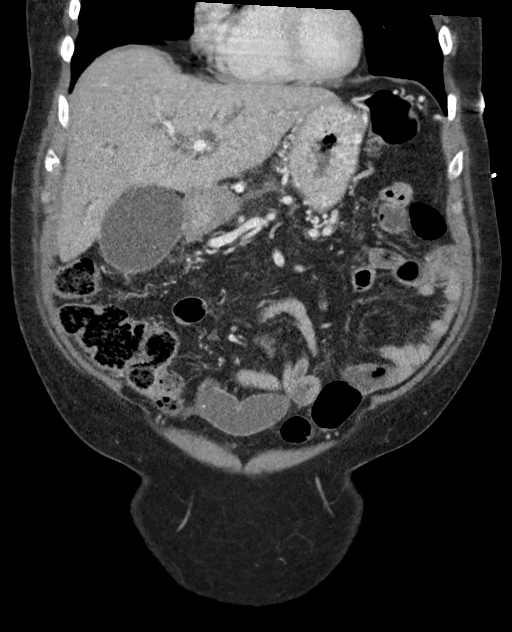
[im 43/97  soft-tissue]
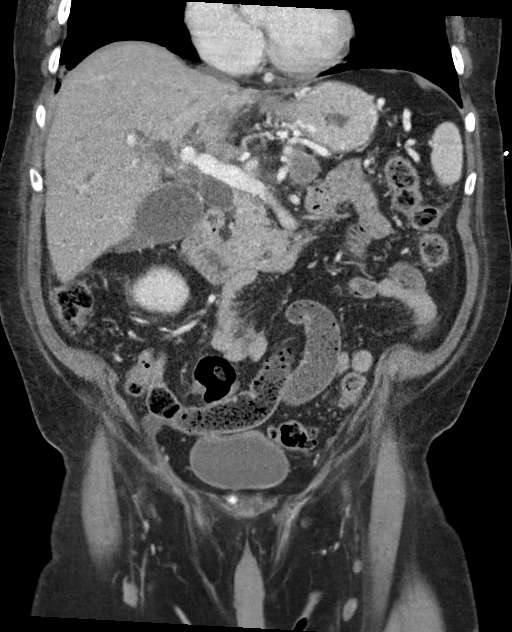
[im 54/97  soft-tissue]
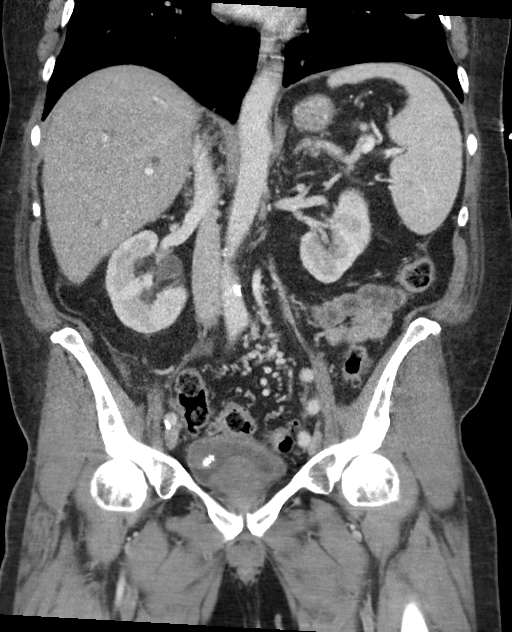

[15 of 46 positions shown; findings below may reference images not displayed]

FINDINGS: Lower chest: Bilateral pulmonary nodules. An irregular left lower
lobe 1.9 cm nodule on [DATE]. More posterior left lower lobe 1.6 cm
nodule on [DATE]. Normal heart size without pericardial or pleural
effusion.

Hepatobiliary: No focal liver lesion. Gallbladder distension,
without specific evidence of acute cholecystitis.

Moderate intrahepatic biliary duct dilatation. The common duct
measures 1.7 cm on coronal image 40. Followed to the level of the
pancreatic head, where it undergoes an abrupt cutoff.

Pancreas: Pancreatic atrophy and upstream duct dilatation. This
continues to the level of the pancreatic head/neck junction, which
soft tissue fullness measures on the order of 2.7 x 3.0 cm on [DATE].
Subtle peripancreatic edema, for which superimposed pancreatitis
cannot be excluded. Example thickening of the anterior pararenal
fascia on [DATE].

Spleen: Too small to characterize splenic lesions are of doubtful
clinical significance. There is a hyperenhancing focus about the
posterior spleen which may represent a hemangioma at 1.5 cm on [DATE].

Adrenals/Urinary Tract: Normal adrenal glands. Punctate lower pole
left renal collecting system calculi. Bilateral too small to
characterize renal lesions. No hydronephrosis. Right bladder base
1.8 cm stone.

Stomach/Bowel: Proximal gastric underdistention. Apparent gastric
wall thickening, including on [DATE], is at least partially secondary.

Scattered colonic diverticula.  Normal small bowel.

Vascular/Lymphatic: Aortic atherosclerosis. Portal vein and
splenoportal confluence involvement by tumor including on [DATE]. No
acute thrombus. Gastroepiploic collaterals related to splenic vein
insufficiency. Preaortic node is not pathologic by size criteria at
8 mm on 37/2. No pelvic sidewall adenopathy.

Reproductive: Normal prostate.

Other: Small volume pelvic fluid. Multifocal omental
thickening/nodularity, including at 1.9 cm on 43/2.

Musculoskeletal: Bilateral hip degenerative changes.
IMPRESSION: 1. Findings most consistent with metastatic pancreatic
adenocarcinoma, as detailed above. Venous involvement with
metastatic disease to the lung bases and omentum.
2. Moderate biliary and gallbladder distension/dilatation. No
specific evidence of acute cholecystitis.
3. Possible peripancreatic edema for which pancreatitis cannot be
excluded.
4. Right bladder stone without hydronephrosis.
5. Left nephrolithiasis.
6. Small volume pelvic fluid.

## 2020-03-19 IMAGING — RF DG ERCP WO/W SPHINCTEROTOMY
1 series · 4 of 4 positions shown · non-contrast
Comparison: CT 11/29/2019

CLINICAL DATA: Jaundice, painless. CT suggests metastatic
pancreatic adenocarcinoma.

EXAM:
ERCP
TECHNIQUE: Multiple spot images obtained with the fluoroscopic device and
submitted for interpretation post-procedure.

[Series 1: unknown protocol · 0.20mm/px · 4 of 4 slices shown]
[im 1/4]
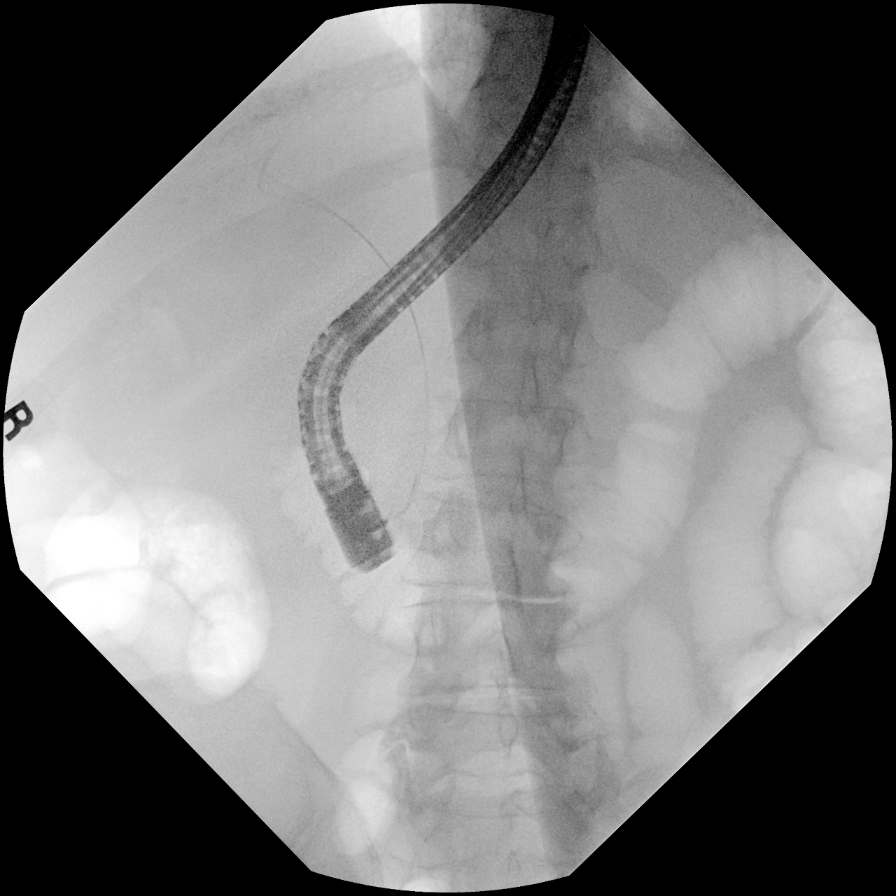
[im 2/4]
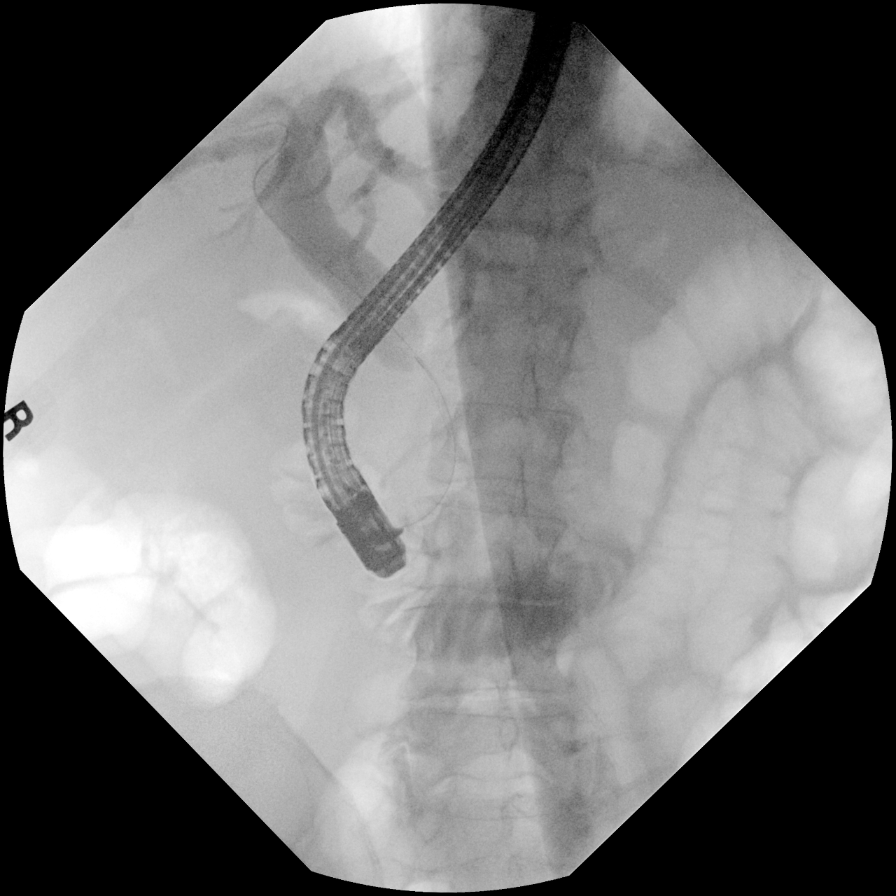
[im 3/4]
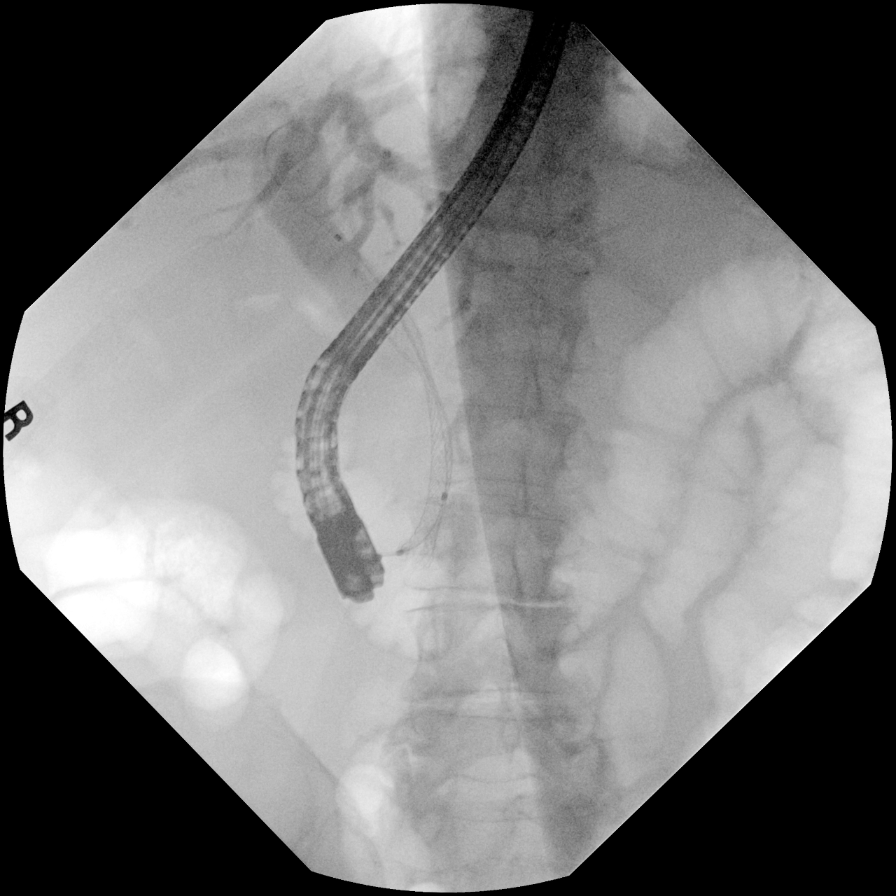
[im 4/4]
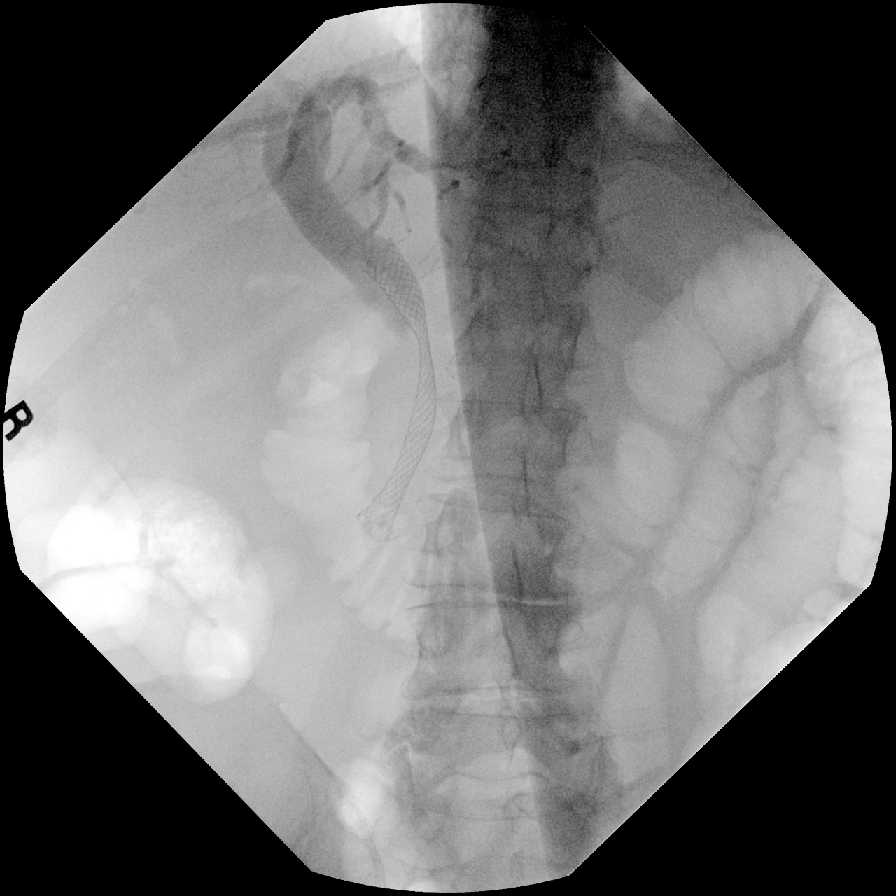

[4 of 4 positions shown; findings below may reference images not displayed]

FINDINGS: A series of fluoroscopic spot images document endoscopic cannulation
and opacification of the CBD. High-grade stenosis in the distal CBD.
Proximal CBD and central visualized intrahepatic ducts are dilated.
The intrahepatic biliary tree is incompletely visualized. Subsequent
images document placement of a metallic biliary stent across the
distal CBD into the duodenum, with persistent tapered narrowing in
the midportion of the stent on the final image.
IMPRESSION: 1. Distal CBD high-grade stenosis/occlusion, with endoscopic
metallic biliary stent placement.

These images were submitted for radiologic interpretation only.
Please see the procedural report for the amount of contrast and the
fluoroscopy time utilized.

## 2022-03-04 ENCOUNTER — Ambulatory Visit (INDEPENDENT_AMBULATORY_CARE_PROVIDER_SITE_OTHER): Payer: Medicare Other | Admitting: Urology

## 2022-03-04 ENCOUNTER — Encounter: Payer: Self-pay | Admitting: Urology

## 2022-03-04 VITALS — BP 132/72 | HR 82 | Ht 66.0 in | Wt 153.0 lb

## 2022-03-04 DIAGNOSIS — N138 Other obstructive and reflux uropathy: Secondary | ICD-10-CM | POA: Diagnosis not present

## 2022-03-04 DIAGNOSIS — R310 Gross hematuria: Secondary | ICD-10-CM

## 2022-03-04 DIAGNOSIS — N210 Calculus in bladder: Secondary | ICD-10-CM | POA: Diagnosis not present

## 2022-03-04 DIAGNOSIS — R3129 Other microscopic hematuria: Secondary | ICD-10-CM

## 2022-03-04 DIAGNOSIS — N401 Enlarged prostate with lower urinary tract symptoms: Secondary | ICD-10-CM

## 2022-03-04 DIAGNOSIS — Z87898 Personal history of other specified conditions: Secondary | ICD-10-CM

## 2022-03-04 LAB — MICROSCOPIC EXAMINATION
Bacteria, UA: NONE SEEN
Epithelial Cells (non renal): NONE SEEN /hpf (ref 0–10)
Renal Epithel, UA: NONE SEEN /hpf
WBC, UA: NONE SEEN /hpf (ref 0–5)

## 2022-03-04 LAB — URINALYSIS, ROUTINE W REFLEX MICROSCOPIC
Bilirubin, UA: NEGATIVE
Glucose, UA: NEGATIVE
Ketones, UA: NEGATIVE
Leukocytes,UA: NEGATIVE
Nitrite, UA: NEGATIVE
Specific Gravity, UA: 1.015 (ref 1.005–1.030)
Urobilinogen, Ur: 0.2 mg/dL (ref 0.2–1.0)
pH, UA: 6 (ref 5.0–7.5)

## 2022-03-04 MED ORDER — TAMSULOSIN HCL 0.4 MG PO CAPS: 0.8000 mg | ORAL_CAPSULE | Freq: Every day | ORAL | 3 refills | Status: AC

## 2022-03-04 NOTE — Progress Notes (Signed)
Assessment: 1. Bladder calculi   2. BPH with obstruction/lower urinary tract symptoms   3. History of gross hematuria     Plan: I reviewed the patient's records including lab results and imaging results. I am unable to view the CT studies from [facility] during today's visit. His bladder calculi are likely causing his lower urinary tract symptoms including the episodes of gross hematuria.  I discussed options for management of the bladder calculi including cystoscopy with litholapaxy as well as management of his BPH.  Unfortunately given his metastatic pancreatic cancer and ongoing chemotherapy, he is at increased perioperative risk, potentially making surgical management unfeasible.  I recommended that he discuss his current situation further with his oncologist.  He is scheduled to receive 2 more cycles of chemotherapy followed by follow-up labs and repeat CT scan to determine efficacy.  I also advised him that any surgical procedures would be best performed at a medical facility with a full complement of specialists given his current medical condition. We will tentatively schedule him for follow-up in approximately 1 month for flexible cystoscopy.  Chief Complaint:  Chief Complaint  Patient presents with   bladder stone    History of Present Illness:  Jose Hale is a 81 y.o. year old male who is seen in consultation from Quentin Cornwall, MD for evaluation of bladder calculi and lower urinary tract symptoms.  The patient has a history of metastatic pancreatic cancer diagnosed approximately 2 years ago.  He is currently receiving chemotherapy with gemcitabine and cisplatin.  He has a history of BPH for a number of years and is currently being managed with tamsulosin and dutasteride.  He has recently noted several episodes of gross hematuria.  He also had associated dysuria and some difficulty voiding.  He had a CT scan in March 2023 which showed evidence of bladder calculi, the largest measuring  3 cm in size.  He thinks he may have passed some small stones recently.  He has continued symptoms of frequency, urgency, decreased force of stream, intermittent stream, and sensation of incomplete emptying.  He also reports occasional dysuria.  No history of UTIs. IPSS = 27 today.  He was recently seen by urology at [facility] for the symptoms.  He was not felt to be a good surgical candidate.  He was referred to a and a urology specialist for discussion of options.  His Flomax was increased to 0.8 mg daily and he was started on tadalafil 5 mg daily.  CT from 11/29/2019 showed a punctate lower pole left renal collecting system calculus and a 1.8 cm stone at the right bladder base. CT from 3/23 showed punctate nonobstructing left renal calculi, multiple bladder calculi measuring up to 3.3 cm.  Past Medical History:  Past Medical History:  Diagnosis Date   GERD (gastroesophageal reflux disease)    Jaundice 11/2019   Urolithiasis     Past Surgical History:  Past Surgical History:  Procedure Laterality Date   BILIARY STENT PLACEMENT  12/02/2019   Procedure: BILIARY STENT PLACEMENT;  Surgeon: Carol Ada, MD;  Location: [place];  Service: Endoscopy;;   CATARACT EXTRACTION Left 10/2019   ERCP N/A 11/30/2019   Procedure: UNSUCCESSFUL ENDOSCOPIC RETROGRADE CHOLANGIOPANCREATOGRAPHY (ERCP);  Surgeon: Rogene Houston, MD;  Location: [facility];  Service: Endoscopy;  Laterality: N/A;   ERCP N/A 12/02/2019   Procedure: ENDOSCOPIC RETROGRADE CHOLANGIOPANCREATOGRAPHY (ERCP);  Surgeon: Carol Ada, MD;  Location: [place];  Service: Endoscopy;  Laterality: N/A;   ESOPHAGOGASTRODUODENOSCOPY (EGD) WITH  PROPOFOL N/A 12/09/2019   Procedure: ESOPHAGOGASTRODUODENOSCOPY (EGD) WITH PROPOFOL;  Surgeon: Carol Ada, MD;  Location: [facility];  Service: Endoscopy;  Laterality: N/A;   EYE SURGERY     jan 2006   FINE NEEDLE ASPIRATION N/A 12/09/2019   Procedure: FINE NEEDLE ASPIRATION (FNA) LINEAR;   Surgeon: Carol Ada, MD;  Location: [facility];  Service: Endoscopy;  Laterality: N/A;   SPHINCTEROTOMY  12/02/2019   Procedure: SPHINCTEROTOMY;  Surgeon: Carol Ada, MD;  Location: [place];  Service: Endoscopy;;   UPPER ESOPHAGEAL ENDOSCOPIC ULTRASOUND (EUS) N/A 12/09/2019   Procedure: UPPER ESOPHAGEAL ENDOSCOPIC ULTRASOUND (EUS);  Surgeon: Carol Ada, MD;  Location: [facility];  Service: Endoscopy;  Laterality: N/A;    Allergies:  No Known Allergies  Family History:  Family History  Problem Relation Age of Onset   Gallbladder disease Mother    Heart disease Father    Benign prostatic hyperplasia Father    Gallbladder disease Sister    GER disease Sister     Social History:  Social History   Tobacco Use   Smoking status: Former   Smokeless tobacco: Never  Substance Use Topics   Alcohol use: Yes    Alcohol/week: 5.0 standard drinks    Types: 1 Glasses of wine, 2 Cans of beer, 2 Shots of liquor per week    Comment: 2-4 times a week   Drug use: Never    Review of symptoms:  Constitutional:  Negative for unexplained weight loss, night sweats, fever, chills ENT:  Negative for nose bleeds, sinus pain, painful swallowing CV:  Negative for chest pain, shortness of breath, exercise intolerance, palpitations, loss of consciousness Resp:  Negative for cough, wheezing, shortness of breath GI:  Negative for nausea, vomiting, diarrhea, bloody stools GU:  Positives noted in HPI; otherwise negative for urinary incontinence Neuro:  Negative for seizures, poor balance, limb weakness, slurred speech Psych:  Negative for lack of energy, depression, anxiety Endocrine:  Negative for polydipsia, polyuria, symptoms of hypoglycemia (dizziness, hunger, sweating) Hematologic:  Negative for anemia, purpura, petechia, prolonged or excessive bleeding, use of anticoagulants  Allergic:  Negative for difficulty breathing or choking as a result of exposure to anything; no shellfish  allergy; no allergic response (rash/itch) to materials, foods  Physical exam: BP 132/72   Pulse 82   Ht '5\' 6"'$  (1.676 m)   Wt 153 lb (69.4 kg)   BMI 24.69 kg/m  GENERAL APPEARANCE:  Well appearing, well developed, well nourished, NAD HEENT: Atraumatic, Normocephalic, oropharynx clear. NECK: Supple without lymphadenopathy or thyromegaly. LUNGS: Clear to auscultation bilaterally. HEART: Regular Rate and Rhythm without murmurs, gallops, or rubs. ABDOMEN: Soft, non-tender, No Masses. EXTREMITIES: Moves all extremities well.  Without clubbing, cyanosis, or edema. NEUROLOGIC:  Alert and oriented x 3, normal gait, CN II-XII grossly intact.  MENTAL STATUS:  Appropriate. BACK:  Non-tender to palpation.  No CVAT SKIN:  Warm, dry and intact.    Results: Results for orders placed or performed in visit on 03/04/22 (from the past 24 hour(s))  Microscopic Examination   Collection Time: 03/04/22 11:25 AM   Urine  Result Value Ref Range   WBC, UA None seen 0 - 5 /hpf   RBC 0-2 0 - 2 /hpf   Epithelial Cells (non renal) None seen 0 - 10 /hpf   Renal Epithel, UA None seen None seen /hpf   Mucus, UA Present Not Estab.   Bacteria, UA None seen None seen/Few  Urinalysis, Routine w reflex microscopic   Collection Time: 03/04/22  11:25 AM  Result Value Ref Range   Specific Gravity, UA 1.015 1.005 - 1.030   pH, UA 6.0 5.0 - 7.5   Color, UA Yellow Yellow   Appearance Ur Clear Clear   Leukocytes,UA Negative Negative   Protein,UA 1+ (A) Negative/Trace   Glucose, UA Negative Negative   Ketones, UA Negative Negative   RBC, UA Trace (A) Negative   Bilirubin, UA Negative Negative   Urobilinogen, Ur 0.2 0.2 - 1.0 mg/dL   Nitrite, UA Negative Negative   Microscopic Examination See below:

## 2022-04-15 ENCOUNTER — Other Ambulatory Visit: Payer: Medicare Other | Admitting: Urology

## 2022-04-22 ENCOUNTER — Other Ambulatory Visit: Payer: Medicare Other | Admitting: Urology

## 2022-09-05 DEATH — deceased
